# Patient Record
Sex: Male | Born: 1960 | Race: White | Hispanic: No | Marital: Single | Smoking: Never smoker
Health system: Southern US, Community
[De-identification: ages and names within clinical notes are randomized; demographics above are authoritative.]

## PROBLEM LIST (undated history)

## (undated) DIAGNOSIS — G629 Polyneuropathy, unspecified: Secondary | ICD-10-CM

## (undated) DIAGNOSIS — J329 Chronic sinusitis, unspecified: Secondary | ICD-10-CM

## (undated) DIAGNOSIS — I10 Essential (primary) hypertension: Secondary | ICD-10-CM

## (undated) DIAGNOSIS — R002 Palpitations: Secondary | ICD-10-CM

## (undated) HISTORY — PX: HIP SURGERY: SHX245

## (undated) HISTORY — PX: LUMBAR DISC SURGERY: SHX700

---

## 2020-04-06 ENCOUNTER — Institutional Professional Consult (permissible substitution): Payer: Self-pay | Admitting: Family Medicine

## 2020-04-18 ENCOUNTER — Other Ambulatory Visit: Payer: Self-pay

## 2020-04-18 ENCOUNTER — Encounter: Payer: Self-pay | Admitting: Family Medicine

## 2020-04-18 ENCOUNTER — Ambulatory Visit (INDEPENDENT_AMBULATORY_CARE_PROVIDER_SITE_OTHER): Payer: BC Managed Care – PPO | Admitting: Family Medicine

## 2020-04-18 VITALS — BP 112/8 | HR 72 | Temp 97.1°F | Ht 74.5 in | Wt 204.0 lb

## 2020-04-18 DIAGNOSIS — Z23 Encounter for immunization: Secondary | ICD-10-CM | POA: Diagnosis not present

## 2020-04-18 DIAGNOSIS — Z1211 Encounter for screening for malignant neoplasm of colon: Secondary | ICD-10-CM

## 2020-04-18 DIAGNOSIS — Z0000 Encounter for general adult medical examination without abnormal findings: Secondary | ICD-10-CM

## 2020-04-18 DIAGNOSIS — Z96641 Presence of right artificial hip joint: Secondary | ICD-10-CM | POA: Diagnosis not present

## 2020-04-18 DIAGNOSIS — Z9889 Other specified postprocedural states: Secondary | ICD-10-CM | POA: Diagnosis not present

## 2020-04-18 DIAGNOSIS — Z1159 Encounter for screening for other viral diseases: Secondary | ICD-10-CM

## 2020-04-18 NOTE — Progress Notes (Signed)
nde  Subjective:    Patient ID: Nicholas Bright, male    DOB: April 12, 1960, 60 y.o.   MRN: [id number]  HPI He is here for complete examination. He is the new Runner, broadcasting/film/video at [employer]. He does have a previous history of low back pain with lumbar surgery and is also had injections. He is also had right hip replacement. He seems to doing well from them but does occasionally have back pain. He is planning to start a exercise and weight training regimen. His immunizations were reviewed. He does not smoke and drinks socially. His father had CABG at age 60 and mother had a stent at age 60. Otherwise family and social history as well as health maintenance and immunizations was reviewed   Review of Systems  All other systems reviewed and are negative.      Objective:   Physical Exam Alert and in no distress. Tympanic membranes and canals are normal. Pharyngeal area is normal. Neck is supple without adenopathy or thyromegaly. Cardiac exam shows a regular sinus rhythm without murmurs or gallops. Lungs are clear to auscultation. Abdominal exam shows no masses or tenderness with normal bowel sounds EKG read by me does show early RBBB as well as possible LVH.  Ventricular rate around 78.      Assessment & Plan:  Routine general medical examination at a health care facility - Plan: CBC with Differential/Platelet, Comprehensive metabolic panel, Lipid panel, EKG 12-Lead  Need for Tdap vaccination  S/P total right hip arthroplasty  History of lumbar laminectomy  Screening for colon cancer - Plan: Cologuard  Need for hepatitis C screening test - Plan: Hepatitis C antibody Encouraged him to get involved and stay involved with exercise. Discussed his back pain with him and when it starts to give him trouble, he will call me and I will expedite getting him taken care of to be seen by orthopedics and possibly set up for epidurals. He was comfortable with that. He will return in the future for Shingrix

## 2020-04-19 ENCOUNTER — Encounter: Payer: Self-pay | Admitting: Family Medicine

## 2020-04-19 LAB — COMPREHENSIVE METABOLIC PANEL
ALT: 25 IU/L (ref 0–44)
AST: 33 IU/L (ref 0–40)
Albumin/Globulin Ratio: 1.8 (ref 1.2–2.2)
Albumin: 4.6 g/dL (ref 3.8–4.8)
Alkaline Phosphatase: 60 IU/L (ref 44–121)
BUN/Creatinine Ratio: 14 (ref 10–24)
BUN: 13 mg/dL (ref 8–27)
Bilirubin Total: 0.6 mg/dL (ref 0.0–1.2)
CO2: 20 mmol/L (ref 20–29)
Calcium: 9.7 mg/dL (ref 8.6–10.2)
Chloride: 105 mmol/L (ref 96–106)
Creatinine, Ser: 0.96 mg/dL (ref 0.76–1.27)
GFR calc Af Amer: 98 mL/min/{1.73_m2} (ref >59)
GFR calc non Af Amer: 85 mL/min/{1.73_m2} (ref >59)
Globulin, Total: 2.6 g/dL (ref 1.5–4.5)
Glucose: 99 mg/dL (ref 65–99)
Potassium: 4.6 mmol/L (ref 3.5–5.2)
Sodium: 141 mmol/L (ref 134–144)
Total Protein: 7.2 g/dL (ref 6.0–8.5)

## 2020-04-19 LAB — LIPID PANEL
Chol/HDL Ratio: 2.8 ratio (ref 0.0–5.0)
Cholesterol, Total: 178 mg/dL (ref 100–199)
HDL: 63 mg/dL (ref >39)
LDL Chol Calc (NIH): 95 mg/dL (ref 0–99)
Triglycerides: 111 mg/dL (ref 0–149)
VLDL Cholesterol Cal: 20 mg/dL (ref 5–40)

## 2020-04-19 LAB — CBC WITH DIFFERENTIAL/PLATELET
Basophils Absolute: 0.1 10*3/uL (ref 0.0–0.2)
Basos: 1 %
EOS (ABSOLUTE): 0.4 10*3/uL (ref 0.0–0.4)
Eos: 6 %
Hematocrit: 45 % (ref 37.5–51.0)
Hemoglobin: 15.5 g/dL (ref 13.0–17.7)
Immature Grans (Abs): 0 10*3/uL (ref 0.0–0.1)
Immature Granulocytes: 0 %
Lymphocytes Absolute: 2 10*3/uL (ref 0.7–3.1)
Lymphs: 29 %
MCH: 33.3 pg — ABNORMAL HIGH (ref 26.6–33.0)
MCHC: 34.4 g/dL (ref 31.5–35.7)
MCV: 97 fL (ref 79–97)
Monocytes Absolute: 0.6 10*3/uL (ref 0.1–0.9)
Monocytes: 9 %
Neutrophils Absolute: 3.9 10*3/uL (ref 1.4–7.0)
Neutrophils: 55 %
Platelets: 207 10*3/uL (ref 150–450)
RBC: 4.66 x10E6/uL (ref 4.14–5.80)
RDW: 12.1 % (ref 11.6–15.4)
WBC: 7 10*3/uL (ref 3.4–10.8)

## 2020-04-19 LAB — HEPATITIS C ANTIBODY: Hep C Virus Ab: <0.1 s/co ratio (ref 0.0–0.9)

## 2020-08-17 ENCOUNTER — Telehealth: Payer: Self-pay

## 2020-08-17 DIAGNOSIS — Z8249 Family history of ischemic heart disease and other diseases of the circulatory system: Secondary | ICD-10-CM

## 2020-08-17 DIAGNOSIS — Z1211 Encounter for screening for malignant neoplasm of colon: Secondary | ICD-10-CM

## 2020-08-17 HISTORY — PX: SKIN BIOPSY: SHX1

## 2020-08-17 NOTE — Telephone Encounter (Signed)
Pt. Called stating that he would like to do the cologuard instead of having a colonoscopy. He also wanted to know who you recommended for cardiology he would like to get in with one he has a family h/o of heart attacks and would just like a check up with a cardiologists.

## 2020-10-18 NOTE — Progress Notes (Signed)
Cardiology Office Note   Date:  10/20/2020   ID:  Nicholas Bright, DOB 08-15-1960, MRN [id number]  PCP:  Ronnald Nian, MD  Cardiologist:   None Referring:  Ronnald Nian, MD  Chief Complaint  Patient presents with   Abnormal ECG       History of Present Illness: Nicholas Bright is a 60 y.o. male who presents for evaluation of an abnormal EKG.  He is referred by Dr. Susann Givens.  He has no prior cardiac history.  He does have a family history of early coronary artery disease though he himself has never had any disease.  He is athletic and exercises routinely. The patient denies any new symptoms such as chest discomfort, neck or arm discomfort. There has been no new shortness of breath, PND or orthopnea. There have been no reported palpitations, presyncope or syncope.    He was found recently to have RSR prime in V1 and V2 on his EKG.  PMH:  None  Past Surgical History:  Procedure Laterality Date   HIP SURGERY     LUMBAR DISC SURGERY     SKIN BIOPSY Bilateral 08/17/2020   pigmented seborrheic keratosis, irritated     Current Outpatient Medications  Medication Sig Dispense Refill   Multiple Vitamins-Minerals (AIRBORNE PO) Take by mouth.     No current facility-administered medications for this visit.    Allergies:   Patient has no known allergies.    Social History:  The patient  reports that he has never smoked. He has never used smokeless tobacco. He reports current alcohol use of about 2.0 standard drinks per week. He reports that he does not use drugs.   Family History:  The patient's family history includes CAD (age of onset: 38) in his father; Congestive Heart Failure in his father.    ROS:  Please see the history of present illness.   Otherwise, review of systems are positive for none.   All other systems are reviewed and negative.    PHYSICAL EXAM: VS:  BP 130/80   Pulse 86   Ht 6\' 3"  (1.905 m)   Wt 205 lb 3.2 oz (93.1 kg)   SpO2 94%   BMI 25.65 kg/m  ,  BMI Body mass index is 25.65 kg/m. GENERAL:  Well appearing HEENT:  Pupils equal round and reactive, fundi not visualized, oral mucosa unremarkable NECK:  No jugular venous distention, waveform within normal limits, carotid upstroke brisk and symmetric, no bruits, no thyromegaly LYMPHATICS:  No cervical, inguinal adenopathy LUNGS:  Clear to auscultation bilaterally BACK:  No CVA tenderness CHEST:  Unremarkable HEART:  PMI not displaced or sustained,S1 and S2 within normal limits, no S3, no S4, no clicks, no rubs, NO murmurs ABD:  Flat, positive bowel sounds normal in frequency in pitch, no bruits, no rebound, no guarding, no midline pulsatile mass, no hepatomegaly, no splenomegaly EXT:  2 plus pulses throughout, no edema, no cyanosis no clubbing SKIN:  No rashes no nodules NEURO:  Cranial nerves II through XII grossly intact, motor grossly intact throughout PSYCH:  Cognitively intact, oriented to person place and time    EKG:  EKG is ordered today. The ekg ordered today demonstrates sinus rhythm, rate 86, axis within normal limits, intervals within normal limits, no acute ST-T wave changes.   Recent Labs: 04/18/2020: ALT 25; BUN 13; Creatinine, Ser 0.96; Hemoglobin 15.5; Platelets 207; Potassium 4.6; Sodium 141    Lipid Panel    Component Value Date/Time   CHOL  178 04/18/2020 1624   TRIG 111 04/18/2020 1624   HDL 63 04/18/2020 1624   CHOLHDL 2.8 04/18/2020 1624   LDLCALC 95 04/18/2020 1624      Wt Readings from Last 3 Encounters:  10/20/20 205 lb 3.2 oz (93.1 kg)  04/18/20 204 lb (92.5 kg)      Other studies Reviewed: Additional studies/ records that were reviewed today include: EKG and labs. Review of the above records demonstrates:  Please see elsewhere in the note.     ASSESSMENT AND PLAN:  Abnormal EKG: This looks to be just an RSR prime or interventricular conduction delay but does not represent any significant pathology.  No change in therapy.  Risk reduction:  He has a strong family history of early coronary artery disease.  I have suggested a coronary calcium score.  Dyslipidemia: His LDL is mildly elevated at 95.  Goals of therapy will be based on the results of the follow-up.  Of note we talked about a plant based diet.  Palpitations: He has some vague symptoms in the morning of his heart beating strong but he is doing some reading.  I have suggested a Kardia   Current medicines are reviewed at length with the patient today.  The patient does not have concerns regarding medicines.  The following changes have been made:  no change  Labs/ tests ordered today include:   Orders Placed This Encounter  Procedures   CT CARDIAC SCORING (SELF PAY ONLY)   EKG 12-Lead      Disposition:   FU with me as needed.      Signed, Rollene Rotunda, MD  10/20/2020 9:17 AM    [facility]

## 2020-10-20 ENCOUNTER — Ambulatory Visit: Payer: BC Managed Care – PPO | Admitting: Cardiology

## 2020-10-20 ENCOUNTER — Encounter: Payer: Self-pay | Admitting: Cardiology

## 2020-10-20 ENCOUNTER — Other Ambulatory Visit: Payer: Self-pay

## 2020-10-20 VITALS — BP 130/80 | HR 86 | Ht 75.0 in | Wt 205.2 lb

## 2020-10-20 DIAGNOSIS — Z8249 Family history of ischemic heart disease and other diseases of the circulatory system: Secondary | ICD-10-CM | POA: Diagnosis not present

## 2020-10-20 NOTE — Patient Instructions (Signed)
Medication Instructions:  NO CHANGES *If you need a refill on your cardiac medications before your next appointment, please call your pharmacy*   Testing/Procedures: Dr. Antoine Poche has ordered a CT coronary calcium score. This test is done at [address] [address]. This is $99 out of pocket.   Coronary CalciumScan A coronary calcium scan is an imaging test used to look for deposits of calcium and other fatty materials (plaques) in the inner lining of the blood vessels of the heart (coronary arteries). These deposits of calcium and plaques can partly clog and narrow the coronary arteries without producing any symptoms or warning signs. This puts a person at risk for a heart attack. This test can detect these deposits before symptoms develop. Tell a health care provider about: Any allergies you have. All medicines you are taking, including vitamins, herbs, eye drops, creams, and over-the-counter medicines. Any problems you or family members have had with anesthetic medicines. Any blood disorders you have. Any surgeries you have had. Any medical conditions you have. Whether you are pregnant or may be pregnant. What are the risks? Generally, this is a safe procedure. However, problems may occur, including: Harm to a pregnant woman and her unborn baby. This test involves the use of radiation. Radiation exposure can be dangerous to a pregnant woman and her unborn baby. If you are pregnant, you generally should not have this procedure done. Slight increase in the risk of cancer. This is because of the radiation involved in the test. What happens before the procedure? No preparation is needed for this procedure. What happens during the procedure? You will undress and remove any jewelry around your neck or chest. You will put on a hospital gown. Sticky electrodes will be placed on your chest. The electrodes will be connected to an electrocardiogram (ECG) machine to record a tracing of the  electrical activity of your heart. A CT scanner will take pictures of your heart. During this time, you will be asked to lie still and hold your breath for 2-3 seconds while a picture of your heart is being taken. The procedure may vary among health care providers and hospitals. What happens after the procedure? You can get dressed. You can return to your normal activities. It is up to you to get the results of your test. Ask your health care provider, or the department that is doing the test, when your results will be ready. Summary A coronary calcium scan is an imaging test used to look for deposits of calcium and other fatty materials (plaques) in the inner lining of the blood vessels of the heart (coronary arteries). Generally, this is a safe procedure. Tell your health care provider if you are pregnant or may be pregnant. No preparation is needed for this procedure. A CT scanner will take pictures of your heart. You can return to your normal activities after the scan is done. This information is not intended to replace advice given to you by your health care provider. Make sure you discuss any questions you have with your health care provider. Document Released: 08/10/2007 Document Revised: 01/01/2016 Document Reviewed: 01/01/2016 Elsevier Interactive Patient Education  2017 [employer].    Follow-Up: At [facility], you and your health needs are our priority.  As part of our continuing mission to provide you with exceptional heart care, we have created designated Provider Care Teams.  These Care Teams include your primary Cardiologist (physician) and Advanced Practice Providers (APPs -  Physician Assistants and Nurse  Practitioners) who all work together to provide you with the care you need, when you need it.  We recommend signing up for the patient portal called "MyChart".  Sign up information is provided on this After Visit Summary.  MyChart is used to connect with patients for  Virtual Visits (Telemedicine).  Patients are able to view lab/test results, encounter notes, upcoming appointments, etc.  Non-urgent messages can be sent to your provider as well.   To learn more about what you can do with MyChart, go to [email or web address].    Your next appointment:   AS NEEDED with Dr. Antoine Poche  Other Instructions  [employer] by Macy Mis documentary - Gamechanger

## 2020-11-16 ENCOUNTER — Other Ambulatory Visit: Payer: Self-pay

## 2020-11-16 ENCOUNTER — Ambulatory Visit (INDEPENDENT_AMBULATORY_CARE_PROVIDER_SITE_OTHER)
Admission: RE | Admit: 2020-11-16 | Discharge: 2020-11-16 | Disposition: A | Discharge status: Home / Self Care | Payer: Self-pay | Source: Ambulatory Visit | Attending: Cardiology | Admitting: Cardiology

## 2020-11-16 DIAGNOSIS — Z8249 Family history of ischemic heart disease and other diseases of the circulatory system: Secondary | ICD-10-CM

## 2020-11-20 ENCOUNTER — Telehealth: Payer: Self-pay | Admitting: *Deleted

## 2020-11-20 MED ORDER — ZOLPIDEM TARTRATE 10 MG PO TABS: 10.0000 mg | ORAL_TABLET | Freq: Every evening | ORAL | 1 refills | Status: DC | PRN

## 2020-11-20 NOTE — Telephone Encounter (Signed)
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 He was wondering if you could call something in for sleep. He think he may have taken [place] about 15 years ago. But is willing to take anything that you recommend. Patient uses Leonie Douglas.

## 2021-01-22 ENCOUNTER — Telehealth: Payer: Self-pay

## 2021-01-22 NOTE — Telephone Encounter (Signed)
Pt called and advise he is having sinus infection. Normally comes in for z pack but pt is out of town. Please advise [facility]

## 2021-01-23 ENCOUNTER — Telehealth: Payer: BC Managed Care – PPO | Admitting: Family Medicine

## 2021-03-12 ENCOUNTER — Other Ambulatory Visit (INDEPENDENT_AMBULATORY_CARE_PROVIDER_SITE_OTHER): Payer: BC Managed Care – PPO | Admitting: Orthopaedic Surgery

## 2021-03-12 ENCOUNTER — Ambulatory Visit (HOSPITAL_BASED_OUTPATIENT_CLINIC_OR_DEPARTMENT_OTHER): Payer: Self-pay | Admitting: Orthopaedic Surgery

## 2021-03-12 DIAGNOSIS — S8261XA Displaced fracture of lateral malleolus of right fibula, initial encounter for closed fracture: Secondary | ICD-10-CM
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
## 2021-03-13 ENCOUNTER — Ambulatory Visit: Payer: BC Managed Care – PPO | Admitting: Orthopedic Surgery

## 2021-03-13 ENCOUNTER — Ambulatory Visit: Payer: Self-pay

## 2021-03-13 ENCOUNTER — Encounter (HOSPITAL_COMMUNITY): Payer: Self-pay | Admitting: Orthopaedic Surgery

## 2021-03-13 ENCOUNTER — Other Ambulatory Visit: Payer: Self-pay

## 2021-03-13 ENCOUNTER — Encounter: Payer: Self-pay | Admitting: Orthopedic Surgery

## 2021-03-13 VITALS — BP 132/88 | HR 94 | Ht 75.0 in | Wt 205.0 lb

## 2021-03-13 DIAGNOSIS — S6992XA Unspecified injury of left wrist, hand and finger(s), initial encounter: Secondary | ICD-10-CM

## 2021-03-13 DIAGNOSIS — S62337A Displaced fracture of neck of fifth metacarpal bone, left hand, initial encounter for closed fracture: Secondary | ICD-10-CM

## 2021-03-13 DIAGNOSIS — S62339A Displaced fracture of neck of unspecified metacarpal bone, initial encounter for closed fracture: Secondary | ICD-10-CM | POA: Insufficient documentation
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
## 2021-03-13 NOTE — Progress Notes (Signed)
Nicholas Bright denies chest pain or shortness of breath. Patient denies having any s/s of Covid in his household.  Patient denies any known exposure to Covid.   PCP is Dr. Sharlot Gowda.   Nicholas Bright saw Dr. Nanci Pina in 10/20/20 to review an abnomal EKG. Dr. Nanci Pina did not add medication or require any other studies. Patient is to see Dr Nanci Pina if needed.  I instructed patient to wah up well with antibiotic soap, if it is available.  Dry off with a clean towel. Do not put lotion, powder, cologne or deodorant or makeup.No jewelry or piercings. Men may shave their face and neck. Woman should not shave. No nail polish, artificial or acrylic nails. Wear clean clothes, brush your teeth. Glasses, contact lens,dentures or partials may not be worn in the OR. If you need to wear them, please bring a case for glasses, do not wear contacts or bring a case, the hospital does not have contact cases, dentures or partials will have to be removed , make sure they are clean, we will provide a denture cup to put them in. You will need some one to drive you home and a responsible person over the age of 60 to stay with you for the first 24 hours after surgery.

## 2021-03-14 ENCOUNTER — Ambulatory Visit (HOSPITAL_COMMUNITY): Payer: BC Managed Care – PPO | Admitting: Certified Registered Nurse Anesthetist

## 2021-03-14 ENCOUNTER — Encounter (HOSPITAL_COMMUNITY)
Admission: RE | Disposition: A | Discharge status: Home / Self Care | Payer: Self-pay | Source: Home / Self Care | Attending: Orthopaedic Surgery

## 2021-03-14 ENCOUNTER — Ambulatory Visit (HOSPITAL_COMMUNITY): Payer: BC Managed Care – PPO

## 2021-03-14 ENCOUNTER — Other Ambulatory Visit (HOSPITAL_BASED_OUTPATIENT_CLINIC_OR_DEPARTMENT_OTHER): Payer: Self-pay | Admitting: Orthopaedic Surgery

## 2021-03-14 ENCOUNTER — Ambulatory Visit (HOSPITAL_COMMUNITY)
Admission: RE | Admit: 2021-03-14 | Discharge: 2021-03-14 | Disposition: A | Discharge status: Home / Self Care | Payer: BC Managed Care – PPO | Attending: Orthopaedic Surgery | Admitting: Orthopaedic Surgery

## 2021-03-14 ENCOUNTER — Encounter (HOSPITAL_COMMUNITY): Payer: Self-pay | Admitting: Orthopaedic Surgery

## 2021-03-14 DIAGNOSIS — S93432A Sprain of tibiofibular ligament of left ankle, initial encounter: Secondary | ICD-10-CM | POA: Diagnosis not present

## 2021-03-14 DIAGNOSIS — Y9355 Activity, bike riding: Secondary | ICD-10-CM | POA: Diagnosis not present

## 2021-03-14 DIAGNOSIS — S82852A Displaced trimalleolar fracture of left lower leg, initial encounter for closed fracture: Secondary | ICD-10-CM | POA: Insufficient documentation

## 2021-03-14 DIAGNOSIS — S8261XA Displaced fracture of lateral malleolus of right fibula, initial encounter for closed fracture: Secondary | ICD-10-CM

## 2021-03-14 DIAGNOSIS — S62337A Displaced fracture of neck of fifth metacarpal bone, left hand, initial encounter for closed fracture: Secondary | ICD-10-CM | POA: Diagnosis not present

## 2021-03-14 DIAGNOSIS — Z419 Encounter for procedure for purposes other than remedying health state, unspecified: Secondary | ICD-10-CM

## 2021-03-14 HISTORY — PX: OPEN REDUCTION INTERNAL FIXATION (ORIF) METACARPAL: SHX6234

## 2021-03-14 HISTORY — PX: ORIF ANKLE FRACTURE: SHX5408

## 2021-03-14 LAB — CBC
HCT: 39.8 % (ref 39.0–52.0)
Hemoglobin: 13.8 g/dL (ref 13.0–17.0)
MCH: 34.3 pg — ABNORMAL HIGH (ref 26.0–34.0)
MCHC: 34.7 g/dL (ref 30.0–36.0)
MCV: 99 fL (ref 80.0–100.0)
Platelets: 242 10*3/uL (ref 150–400)
RBC: 4.02 MIL/uL — ABNORMAL LOW (ref 4.22–5.81)
RDW: 12.3 % (ref 11.5–15.5)
WBC: 5.9 10*3/uL (ref 4.0–10.5)
nRBC: 0 % (ref 0.0–0.2)

## 2021-03-14 LAB — SURGICAL PCR SCREEN
MRSA, PCR: NEGATIVE
Staphylococcus aureus: NEGATIVE

## 2021-03-14 SURGERY — OPEN REDUCTION INTERNAL FIXATION (ORIF) ANKLE FRACTURE
Anesthesia: General | Site: Finger | Laterality: Left

## 2021-03-14 MED ORDER — IBUPROFEN 800 MG PO TABS
800.0000 mg | ORAL_TABLET | Freq: Three times a day (TID) | ORAL | 0 refills | Status: AC
Start: 2021-03-14 — End: 2021-03-24

## 2021-03-14 MED ORDER — OXYCODONE HCL 5 MG PO TABS
5.0000 mg | ORAL_TABLET | Freq: Once | ORAL | Status: AC
  Administered 2021-03-14: 5 mg via ORAL

## 2021-03-14 MED ORDER — PROPOFOL 10 MG/ML IV BOLUS
INTRAVENOUS | Status: AC
  Filled 2021-03-14: qty 20

## 2021-03-14 MED ORDER — MIDAZOLAM HCL 2 MG/2ML IJ SOLN
INTRAMUSCULAR | Status: AC
  Administered 2021-03-14: 3 mg via INTRAVENOUS
  Filled 2021-03-14: qty 2

## 2021-03-14 MED ORDER — FENTANYL CITRATE (PF) 100 MCG/2ML IJ SOLN
INTRAMUSCULAR | Status: AC
  Filled 2021-03-14: qty 2

## 2021-03-14 MED ORDER — PROPOFOL 10 MG/ML IV BOLUS
INTRAVENOUS | Status: DC | PRN
Start: 2021-03-14 — End: 2021-03-14
  Administered 2021-03-14: 200 mg via INTRAVENOUS

## 2021-03-14 MED ORDER — LIDOCAINE 2% (20 MG/ML) 5 ML SYRINGE
INTRAMUSCULAR | Status: DC | PRN
Start: 2021-03-14 — End: 2021-03-14
  Administered 2021-03-14: 20 mg via INTRAVENOUS

## 2021-03-14 MED ORDER — CLONIDINE HCL (ANALGESIA) 100 MCG/ML EP SOLN
EPIDURAL | Status: DC | PRN
  Administered 2021-03-14 (×3): 50 ug

## 2021-03-14 MED ORDER — VANCOMYCIN HCL 1000 MG IV SOLR
INTRAVENOUS | Status: AC
  Filled 2021-03-14: qty 20

## 2021-03-14 MED ORDER — LIDOCAINE 2% (20 MG/ML) 5 ML SYRINGE
INTRAMUSCULAR | Status: AC
  Filled 2021-03-14: qty 5

## 2021-03-14 MED ORDER — ACETAMINOPHEN 500 MG PO TABS: 500.0000 mg | ORAL_TABLET | Freq: Three times a day (TID) | ORAL | 0 refills | Status: AC

## 2021-03-14 MED ORDER — FENTANYL CITRATE (PF) 100 MCG/2ML IJ SOLN
150.0000 ug | Freq: Once | INTRAMUSCULAR | Status: AC
  Filled 2021-03-14: qty 3

## 2021-03-14 MED ORDER — ROPIVACAINE HCL 5 MG/ML IJ SOLN
INTRAMUSCULAR | Status: DC | PRN
  Administered 2021-03-14: 15 mL via PERINEURAL

## 2021-03-14 MED ORDER — AMISULPRIDE (ANTIEMETIC) 5 MG/2ML IV SOLN: 10.0000 mg | Freq: Once | INTRAVENOUS | Status: DC | PRN

## 2021-03-14 MED ORDER — ASPIRIN EC 325 MG PO TBEC: 325.0000 mg | DELAYED_RELEASE_TABLET | Freq: Every day | ORAL | 0 refills | Status: DC

## 2021-03-14 MED ORDER — OXYCODONE HCL 5 MG PO CAPS
5.0000 mg | ORAL_CAPSULE | ORAL | 0 refills | Status: DC | PRN
Start: 2021-03-14 — End: 2021-03-19

## 2021-03-14 MED ORDER — ORAL CARE MOUTH RINSE: 15.0000 mL | Freq: Once | OROMUCOSAL | Status: AC

## 2021-03-14 MED ORDER — ACETAMINOPHEN 500 MG PO TABS
1000.0000 mg | ORAL_TABLET | Freq: Once | ORAL | Status: AC
  Administered 2021-03-14: 1000 mg via ORAL
  Filled 2021-03-14: qty 2

## 2021-03-14 MED ORDER — DEXAMETHASONE SODIUM PHOSPHATE 4 MG/ML IJ SOLN
INTRAMUSCULAR | Status: DC | PRN
  Administered 2021-03-14: 5 mg via INTRAVENOUS

## 2021-03-14 MED ORDER — ONDANSETRON HCL 4 MG/2ML IJ SOLN
INTRAMUSCULAR | Status: DC | PRN
  Administered 2021-03-14: 4 mg via INTRAVENOUS

## 2021-03-14 MED ORDER — 0.9 % SODIUM CHLORIDE (POUR BTL) OPTIME
TOPICAL | Status: DC | PRN
Start: 2021-03-14 — End: 2021-03-14
  Administered 2021-03-14: 2000 mL

## 2021-03-14 MED ORDER — LACTATED RINGERS IV SOLN: INTRAVENOUS | Status: DC

## 2021-03-14 MED ORDER — FENTANYL CITRATE (PF) 100 MCG/2ML IJ SOLN
INTRAMUSCULAR | Status: DC | PRN
  Administered 2021-03-14: 50 ug via INTRAVENOUS

## 2021-03-14 MED ORDER — FENTANYL CITRATE (PF) 100 MCG/2ML IJ SOLN
INTRAMUSCULAR | Status: AC
  Administered 2021-03-14: 150 ug via INTRAVENOUS
  Filled 2021-03-14: qty 2

## 2021-03-14 MED ORDER — TRANEXAMIC ACID-NACL 1000-0.7 MG/100ML-% IV SOLN
1000.0000 mg | INTRAVENOUS | Status: AC
  Administered 2021-03-14: 1000 mg via INTRAVENOUS
  Filled 2021-03-14: qty 100

## 2021-03-14 MED ORDER — BUPIVACAINE HCL (PF) 0.25 % IJ SOLN
INTRAMUSCULAR | Status: AC
  Filled 2021-03-14: qty 30

## 2021-03-14 MED ORDER — FENTANYL CITRATE (PF) 100 MCG/2ML IJ SOLN
25.0000 ug | INTRAMUSCULAR | Status: DC | PRN
  Administered 2021-03-14: 25 ug via INTRAVENOUS

## 2021-03-14 MED ORDER — CEFAZOLIN SODIUM-DEXTROSE 2-3 GM-%(50ML) IV SOLR
INTRAVENOUS | Status: DC | PRN
  Administered 2021-03-14: 2 g via INTRAVENOUS

## 2021-03-14 MED ORDER — DEXAMETHASONE SODIUM PHOSPHATE 10 MG/ML IJ SOLN
INTRAMUSCULAR | Status: AC
  Filled 2021-03-14: qty 1

## 2021-03-14 MED ORDER — MIDAZOLAM HCL 2 MG/2ML IJ SOLN
3.0000 mg | Freq: Once | INTRAMUSCULAR | Status: AC
  Filled 2021-03-14: qty 3

## 2021-03-14 MED ORDER — CEFAZOLIN SODIUM-DEXTROSE 2-4 GM/100ML-% IV SOLN
2.0000 g | INTRAVENOUS | Status: DC
  Filled 2021-03-14: qty 100

## 2021-03-14 MED ORDER — OXYCODONE HCL 5 MG PO TABS
ORAL_TABLET | ORAL | Status: AC
  Filled 2021-03-14: qty 1

## 2021-03-14 MED ORDER — GABAPENTIN 300 MG PO CAPS
300.0000 mg | ORAL_CAPSULE | Freq: Once | ORAL | Status: AC
  Administered 2021-03-14: 300 mg via ORAL
  Filled 2021-03-14: qty 1

## 2021-03-14 MED ORDER — MIDAZOLAM HCL 5 MG/5ML IJ SOLN
INTRAMUSCULAR | Status: DC | PRN
Start: 2021-03-14 — End: 2021-03-14
  Administered 2021-03-14: 1 mg via INTRAVENOUS

## 2021-03-14 MED ORDER — BUPIVACAINE-EPINEPHRINE (PF) 0.25% -1:200000 IJ SOLN
INTRAMUSCULAR | Status: DC | PRN
  Administered 2021-03-14: 20 mL via PERINEURAL
  Administered 2021-03-14: 30 mL via PERINEURAL

## 2021-03-14 MED ORDER — PHENYLEPHRINE HCL-NACL 20-0.9 MG/250ML-% IV SOLN
INTRAVENOUS | Status: DC | PRN
  Administered 2021-03-14: 20 ug/min via INTRAVENOUS

## 2021-03-14 MED ORDER — ONDANSETRON HCL 4 MG/2ML IJ SOLN
INTRAMUSCULAR | Status: AC
  Filled 2021-03-14: qty 2

## 2021-03-14 MED ORDER — MIDAZOLAM HCL 2 MG/2ML IJ SOLN
INTRAMUSCULAR | Status: AC
  Filled 2021-03-14: qty 2

## 2021-03-14 MED ORDER — CHLORHEXIDINE GLUCONATE 0.12 % MT SOLN
15.0000 mL | Freq: Once | OROMUCOSAL | Status: AC
  Administered 2021-03-14: 15 mL via OROMUCOSAL
  Filled 2021-03-14: qty 15

## 2021-03-14 SURGICAL SUPPLY — 94 items
BAG COUNTER SPONGE SURGICOUNT (BAG) ×6 IMPLANT
BANDAGE ESMARK 6X9 LF (GAUZE/BANDAGES/DRESSINGS) IMPLANT
BIT DRILL 2 CANN GRADUATED (BIT) ×1 IMPLANT
BIT DRILL 2.5 CANN LNG (BIT) ×1 IMPLANT
BIT DRILL 2.7 (BIT) ×1
BIT DRILL 2.7X2.7/3XSCR ANKL (BIT) IMPLANT
BIT DRL 2.7X2.7/3XSCR ANKL (BIT) ×2
BLADE CLIPPER SURG (BLADE) IMPLANT
BNDG COHESIVE 4X5 TAN STRL (GAUZE/BANDAGES/DRESSINGS) ×3 IMPLANT
BNDG ELASTIC 3X5.8 VLCR STR LF (GAUZE/BANDAGES/DRESSINGS) ×3 IMPLANT
BNDG ELASTIC 4X5.8 VLCR STR LF (GAUZE/BANDAGES/DRESSINGS) ×4 IMPLANT
BNDG ELASTIC 6X5.8 VLCR STR LF (GAUZE/BANDAGES/DRESSINGS) ×1 IMPLANT
BNDG ESMARK 4X9 LF (GAUZE/BANDAGES/DRESSINGS) ×3 IMPLANT
BNDG ESMARK 6X9 LF (GAUZE/BANDAGES/DRESSINGS)
BNDG GAUZE ELAST 4 BULKY (GAUZE/BANDAGES/DRESSINGS) ×6 IMPLANT
CORD BIPOLAR FORCEPS 12FT (ELECTRODE) ×3 IMPLANT
COVER SURGICAL LIGHT HANDLE (MISCELLANEOUS) ×6 IMPLANT
CUFF TOURN SGL QUICK 18X4 (TOURNIQUET CUFF) ×3 IMPLANT
CUFF TOURN SGL QUICK 24 (TOURNIQUET CUFF)
CUFF TRNQT CYL 24X4X16.5-23 (TOURNIQUET CUFF) IMPLANT
DRAIN TLS ROUND 10FR (DRAIN) IMPLANT
DRAPE OEC MINIVIEW 54X84 (DRAPES) ×3 IMPLANT
DRAPE SURG 17X11 SM STRL (DRAPES) ×3 IMPLANT
DRAPE U-SHAPE 47X51 STRL (DRAPES) ×3 IMPLANT
DRSG PAD ABDOMINAL 8X10 ST (GAUZE/BANDAGES/DRESSINGS) ×3 IMPLANT
DRSG XEROFORM 1X8 (GAUZE/BANDAGES/DRESSINGS) ×1 IMPLANT
ELECT REM PT RETURN 9FT ADLT (ELECTROSURGICAL) ×3
ELECTRODE REM PT RTRN 9FT ADLT (ELECTROSURGICAL) ×2 IMPLANT
GAUZE 4X4 16PLY ~~LOC~~+RFID DBL (SPONGE) IMPLANT
GAUZE SPONGE 4X4 12PLY STRL (GAUZE/BANDAGES/DRESSINGS) ×5 IMPLANT
GAUZE XEROFORM 5X9 LF (GAUZE/BANDAGES/DRESSINGS) ×1 IMPLANT
GLOVE SURG ORTHO LTX SZ8 (GLOVE) ×3 IMPLANT
GLOVE SURG ORTHO LTX SZ9 (GLOVE) ×3 IMPLANT
GLOVE SURG UNDER POLY LF SZ8.5 (GLOVE) ×3 IMPLANT
GLOVE SURG UNDER POLY LF SZ9 (GLOVE) ×3 IMPLANT
GOWN STRL REUS W/ TWL LRG LVL3 (GOWN DISPOSABLE) ×6 IMPLANT
GOWN STRL REUS W/ TWL XL LVL3 (GOWN DISPOSABLE) ×8 IMPLANT
GOWN STRL REUS W/TWL LRG LVL3 (GOWN DISPOSABLE) ×3
GOWN STRL REUS W/TWL XL LVL3 (GOWN DISPOSABLE) ×4
IMPL TIGHTROP W/DRV K-LESS (Anchor) IMPLANT
IMPLANT TIGHTROPE W/DRV K-LESS (Anchor) ×3 IMPLANT
K-WIRE DBL TROCAR .045X4 (WIRE) ×3
KIT BASIN OR (CUSTOM PROCEDURE TRAY) ×6 IMPLANT
KIT INNATE INSTRUMENT FOR 4.5 (INSTRUMENTS) ×1 IMPLANT
KIT TURNOVER KIT B (KITS) ×6 IMPLANT
KWIRE DBL TROCAR .045X4 (WIRE) IMPLANT
MANIFOLD NEPTUNE II (INSTRUMENTS) ×6 IMPLANT
NAIL IM THRD INNATE 4.5X45 (Nail) ×1 IMPLANT
NDL HYPO 25X1 1.5 SAFETY (NEEDLE) ×2 IMPLANT
NEEDLE HYPO 25X1 1.5 SAFETY (NEEDLE) ×3 IMPLANT
NS IRRIG 1000ML POUR BTL (IV SOLUTION) ×6 IMPLANT
PACK ORTHO EXTREMITY (CUSTOM PROCEDURE TRAY) ×6 IMPLANT
PAD ARMBOARD 7.5X6 YLW CONV (MISCELLANEOUS) ×12 IMPLANT
PAD CAST 3X4 CTTN HI CHSV (CAST SUPPLIES) IMPLANT
PAD CAST 4YDX4 CTTN HI CHSV (CAST SUPPLIES) ×2 IMPLANT
PADDING CAST COTTON 3X4 STRL (CAST SUPPLIES) ×1
PADDING CAST COTTON 4X4 STRL (CAST SUPPLIES) ×3
PADDING CAST COTTON 6X4 STRL (CAST SUPPLIES) ×1 IMPLANT
PLATE THIRD TUBULAR 7 HOLE (Plate) ×1 IMPLANT
SCREW CANCELLOUS 4.0X22 (Screw) ×1 IMPLANT
SCREW LOW PROFILE 3.5X16 (Screw) ×2 IMPLANT
SCREW N/L CORTICAL 2.7X28 (Screw) ×1 IMPLANT
SCREW NLOCK T15 FT 18X3.5XST (Screw) IMPLANT
SCREW NON LOCK 3.5X18MM (Screw) ×1 IMPLANT
SCREW NON LOCKING 4.0X20 (Screw) ×1 IMPLANT
SCREW NON-LOCKING 3.5X12MM (Screw) ×1 IMPLANT
SCREW NON-LOCKING 3.5X20 ANKLE (Screw) ×1 IMPLANT
SLING ARM FOAM STRAP LRG (SOFTGOODS) ×1 IMPLANT
SOAP 2 % CHG 4 OZ (WOUND CARE) ×3 IMPLANT
SPLINT FIBERGLASS 4X30 (CAST SUPPLIES) ×1 IMPLANT
STAPLER VISISTAT 35W (STAPLE) IMPLANT
STRIP CLOSURE SKIN 1/2X4 (GAUZE/BANDAGES/DRESSINGS) IMPLANT
SUCTION FRAZIER HANDLE 10FR (MISCELLANEOUS) ×1
SUCTION TUBE FRAZIER 10FR DISP (MISCELLANEOUS) ×2 IMPLANT
SUT ETHILON 2 0 PSLX (SUTURE) IMPLANT
SUT ETHILON 3 0 FSL (SUTURE) ×2 IMPLANT
SUT ETHILON 4 0 FS 1 (SUTURE) ×1 IMPLANT
SUT ETHILON 4 0 PS 2 18 (SUTURE) IMPLANT
SUT MNCRL AB 4-0 PS2 18 (SUTURE) IMPLANT
SUT MNCRL+ AB 3-0 CT1 36 (SUTURE) IMPLANT
SUT MONOCRYL AB 3-0 CT1 36IN (SUTURE) ×1
SUT VIC AB 0 CT1 27 (SUTURE) ×1
SUT VIC AB 0 CT1 27XBRD ANBCTR (SUTURE) IMPLANT
SUT VIC AB 2-0 CT1 27 (SUTURE) ×1
SUT VIC AB 2-0 CT1 TAPERPNT 27 (SUTURE) ×2 IMPLANT
SUT VIC AB 2-0 FS1 27 (SUTURE) IMPLANT
SUT VICRYL 4-0 PS2 18IN ABS (SUTURE) IMPLANT
SYR CONTROL 10ML LL (SYRINGE) IMPLANT
SYSTEM CHEST DRAIN TLS 7FR (DRAIN) IMPLANT
TOWEL GREEN STERILE (TOWEL DISPOSABLE) ×6 IMPLANT
TOWEL GREEN STERILE FF (TOWEL DISPOSABLE) ×6 IMPLANT
TUBE CONNECTING 12X1/4 (SUCTIONS) ×6 IMPLANT
WATER STERILE IRR 1000ML POUR (IV SOLUTION) ×3 IMPLANT
YANKAUER SUCT BULB TIP NO VENT (SUCTIONS) IMPLANT

## 2021-03-14 NOTE — Anesthesia Preprocedure Evaluation (Signed)
Anesthesia Evaluation  Patient identified by MRN, date of birth, ID band Patient awake    Reviewed: Allergy & Precautions, NPO status , Patient's Chart, lab work & pertinent test results  Airway Mallampati: II  TM Distance: >3 FB     Dental  (+) Dental Advisory Given   Pulmonary neg pulmonary ROS,    breath sounds clear to auscultation       Cardiovascular negative cardio ROS   Rhythm:Regular Rate:Normal     Neuro/Psych negative neurological ROS     GI/Hepatic negative GI ROS, Neg liver ROS,   Endo/Other  negative endocrine ROS  Renal/GU negative Renal ROS     Musculoskeletal   Abdominal   Peds  Hematology negative hematology ROS (+)   Anesthesia Other Findings   Reproductive/Obstetrics                             Anesthesia Physical Anesthesia Plan  ASA: 1  Anesthesia Plan: General   Post-op Pain Management: Tylenol PO (pre-op), Minimal or no pain anticipated and Regional block   Induction: Intravenous  PONV Risk Score and Plan: 2 and Dexamethasone, Ondansetron and Treatment may vary due to age or medical condition  Airway Management Planned: LMA  Additional Equipment: None  Intra-op Plan:   Post-operative Plan: Extubation in OR  Informed Consent: I have reviewed the patients History and Physical, chart, labs and discussed the procedure including the risks, benefits and alternatives for the proposed anesthesia with the patient or authorized representative who has indicated his/her understanding and acceptance.     Dental advisory given  Plan Discussed with: CRNA  Anesthesia Plan Comments:         Anesthesia Quick Evaluation
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
## 2021-03-14 NOTE — Interval H&P Note (Signed)
History and Physical Interval Note:  03/14/2021 10:57 AM  Nicholas Bright  has presented today for surgery, with the diagnosis of Fibula Fracture.  The various methods of treatment have been discussed with the patient and family. After consideration of risks, benefits and other options for treatment, the patient has consented to  Procedure(s): OPEN REDUCTION INTERNAL FIXATION (ORIF) ANKLE FRACTURE (Left) OPEN REDUCTION INTERNAL FIXATION (ORIF) LEFT 5TH METACARPAL (Left) as a surgical intervention.  The patient's history has been reviewed, patient examined, no change in status, stable for surgery.  I have reviewed the patient's chart and labs.  Questions were answered to the patient's satisfaction.     Gerritt Galentine Leighton Luster

## 2021-03-14 NOTE — Discharge Instructions (Addendum)
Discharge Instructions    Attending Surgeon: Vanetta Mulders, MD Office Phone Number: [phone]   Diagnosis and Procedures:    Surgeries Performed: Left ankle open reduction internal fixation  Discharge Plan:    Diet: Resume usual diet. Begin with light or bland foods.  Drink plenty of fluids.  Activity:  Non-weightbearing, utilizing crutches, until seen at postoperative Physical Therapy visit this week. Please keep boot in place You are advised to go home directly from the hospital or surgical center. Restrict your activities.  GENERAL INSTRUCTIONS: 1.  Keep your surgical site elevated above your heart for at least 5-7 days or longer to prevent swelling. This will improve your comfort and your overall recovery following surgery.     2. Please call Dr. Eddie Dibbles office at [phone] with questions Monday-Friday during business hours. If no one answers, please leave a message and someone should get back to the patient within 24 hours. For emergencies please call 911 or proceed to the emergency room.   3. Patient to notify surgical team if experiences any of the following: Bowel/Bladder dysfunction, uncontrolled pain, nerve/muscle weakness, incision with increased drainage or redness, nausea/vomiting and Fever greater than 101.0 F.  Be alert for signs of infection including redness, streaking, odor, fever or chills. Be alert for excessive pain or bleeding and notify your surgeon immediately.  WOUND INSTRUCTIONS:   Leave your dressing/cast/splint in place until your post operative visit.  Keep it clean and dry.  Always keep the incision clean and dry until the staples/sutures are removed. If there is no drainage from the incision you should keep it open to air. If there is drainage from the incision you must keep it covered at all times until the drainage stops  Do not soak in a bath tub, hot tub, pool, lake or other body of water until 21 days after your surgery and  your incision is completely dry and healed.  If you have removable sutures (or staples) they must be removed 10-14 days (unless otherwise instructed) from the day of your surgery.     1)  Elevate the extremity as much as possible.  2)  Keep the dressing clean and dry.  3)  Please call us if the dressing becomes wet or dirty.  4)  If you are experiencing worsening pain or worsening swelling, please call.     MEDICATIONS: Resume all previous home medications at the previous prescribed dose and frequency unless otherwise noted Start taking the  pain medications on an as-needed basis as prescribed  Please taper down pain medication over the next week following surgery.  Ideally you should not require a refill of any narcotic pain medication.  Take pain medication with food to minimize nausea. In addition to the prescribed pain medication, you may take over-the-counter pain relievers such as Tylenol.  Do NOT take additional tylenol if your pain medication already has tylenol in it.  Aspirin 325mg  daily for four weeks.      FOLLOWUP INSTRUCTIONS: 1. Follow up at the Physical Therapy Clinic 3-4 days following surgery. This appointment should be scheduled unless other arrangements have been made.The Physical Therapy scheduling number is [phone] if an appointment has not already been arranged.  2. Contact Dr. Eddie Dibbles office during office hours at [phone] or the practice after hours line at [phone] for non-emergencies. For medical emergencies call 911.   Discharge Location: Home   Nicholas Bright, M.D. Hand Surgery  POST-OPERATIVE DISCHARGE INSTRUCTIONS   PRESCRIPTIONS:  You have been given a prescription to be taken as directed for post-operative pain control.  You may also take over the counter ibuprofen/aleve and tylenol for pain. Take this as directed on the packaging. Do not exceed 3000 mg tylenol/acetaminophen in 24 hours.  Ibuprofen 600-800 mg (3-4) tablets by  mouth every 6 hours as needed for pain.  OR Aleve 2 tablets by mouth every 12 hours (twice daily) as needed for pain.  AND/OR Tylenol 1000 mg (2 tablets) every 8 hours as needed for pain.  Please use your pain medication carefully, as refills are limited and you may not be provided with one.  As stated above, please use over the counter pain medicine - it will also be helpful with decreasing your swelling.    ANESTHESIA: After your surgery, post-surgical discomfort or pain is likely. This discomfort can last several days to a few weeks. At certain times of the day your discomfort may be more intense.   Did you receive a nerve block?  A nerve block can provide pain relief for one hour to two days after your surgery. As long as the nerve block is working, you will experience little or no sensation in the area the surgeon operated on.  As the nerve block wears off, you will begin to experience pain or discomfort. It is very important that you begin taking your prescribed pain medication before the nerve block fully wears off. Treating your pain at the first sign of the block wearing off will ensure your pain is better controlled and more tolerable when full-sensation returns. Do not wait until the pain is intolerable, as the medicine will be less effective. It is better to treat pain in advance than to try and catch up.   General Anesthesia:  If you did not receive a nerve block during your surgery, you will need to start taking your pain medication shortly after your surgery and should continue to do so as prescribed by your surgeon.     ICE AND ELEVATION: You may use ice for the first 48-72 hours, but it is not critical.   Motion of your fingers is very important s to decrease the swelling.  Elevation, as much as possible for the next 48 hours, is critical for decreasing swelling as well as for pain relief. Elevation means when you are seated or lying down, you hand should be at or above your  heart. When walking, the hand needs to be at or above the level of your elbow.  If the bandage gets too tight, it may need to be loosened. Please contact our office and we will instruct you in how to do this.    SURGICAL BANDAGES:  Keep your dressing and/or splint clean and dry at all times.  Do not remove until you are seen again in the office.  If careful, you may place a plastic bag over your bandage and tape the end to shower, but be careful, do not get your bandages wet.     HAND THERAPY:  You may not need any. If you do, we will begin this at your follow up visit in the clinic.    ACTIVITY AND WORK: You are encouraged to move any fingers which are not in the bandage.  Light use of the fingers is allowed to assist the other hand with daily hygiene and eating, but strong gripping or lifting is often uncomfortable and should be avoided.    [facility] [address]  [place],  [place]  [address] [phone]

## 2021-03-14 NOTE — Progress Notes (Signed)
Orthopedic Tech Progress Note Patient Details:  Nicholas Bright 12-16-1960 [id number]  OR RN called re Nicholas Bright I drop off a LARGE CAM WALKER to PACU. Patient had not gotten there yet. Asked PACU RN called if he needed me to apply it to patient   Ortho Devices Type of Ortho Device: CAM walker Ortho Device/Splint Interventions: Ordered, Other (comment)   Post Interventions Patient Tolerated: Well Instructions Provided: Care of device  Donald Pore 03/14/2021, 1:55 PM

## 2021-03-14 NOTE — Anesthesia Procedure Notes (Signed)
Anesthesia Regional Block: Popliteal block   Pre-Anesthetic Checklist: , timeout performed,  Correct Patient, Correct Site, Correct Laterality,  Correct Procedure, Correct Position, site marked,  Risks and benefits discussed,  Surgical consent,  Pre-op evaluation,  At surgeon's request and post-op pain management  Laterality: Left  Prep: chloraprep       Needles:  Injection technique: Single-shot  Needle Type: Echogenic Needle     Needle Length: 9cm  Needle Gauge: 21     Additional Needles:   Procedures:,,,, ultrasound used (permanent image in chart),,    Narrative:  Start time: 03/14/2021 10:55 AM End time: 03/14/2021 11:02 AM Injection made incrementally with aspirations every 5 mL.  Performed by: Personally  Anesthesiologist: Marcene Duos, MD

## 2021-03-14 NOTE — Anesthesia Procedure Notes (Signed)
Anesthesia Regional Block: Other (Isolated left ulnar nerve block)   Pre-Anesthetic Checklist: , timeout performed,  Correct Patient, Correct Site, Correct Laterality,  Correct Procedure, Correct Position, site marked,  Risks and benefits discussed,  Surgical consent,  Pre-op evaluation,  At surgeon's request and post-op pain management  Laterality: Left  Prep: chloraprep       Needles:  Injection technique: Single-shot  Needle Type: Echogenic Stimulator Needle     Needle Length: 9cm  Needle Gauge: 21     Additional Needles:   Procedures:, nerve stimulator,,, ultrasound used (permanent image in chart),,     Nerve Stimulator or Paresthesia:  Response: 4th and 5th digit flextion, 0.5 mA  Additional Responses:   Narrative:  Start time: 03/14/2021 11:10 AM End time: 03/14/2021 11:17 AM Injection made incrementally with aspirations every 5 mL.  Performed by: Personally  Anesthesiologist: Marcene Duos, MD

## 2021-03-14 NOTE — Brief Op Note (Signed)
° °  Brief Op Note  Date of Surgery: 03/14/2021  Preoperative Diagnosis: Fibula Fracture  Postoperative Diagnosis: same  Procedure: Procedure(s): OPEN REDUCTION INTERNAL FIXATION (ORIF) ANKLE FRACTURE OPEN REDUCTION INTERNAL FIXATION (ORIF) LEFT 5TH METACARPAL  Implants: Implant Name Type Inv. Item Serial No. Manufacturer Lot No. LRB No. Used Action  PLATE THIRD TUBULAR 7 HOLE - [id number] Plate PLATE THIRD TUBULAR 7 HOLE  [employer]  Left 1 Implanted  SCREW NON LOCK 3.5X18MM - [id number] Screw SCREW NON LOCK 3.5X18MM  [employer]  Left 1 Implanted  SCREW LOW PROFILE 3.5X16 - [id number] Screw SCREW LOW PROFILE 3.5X16  [employer]  Left 2 Implanted  SCREW CANCELLOUS 4.0X22 - [id number] Screw SCREW CANCELLOUS 4.0X22  [employer]  Left 1 Implanted  SCREW NON LOCKING 4.0X20 - [id number] Screw SCREW NON LOCKING 4.0X20  [employer]  Left 1 Implanted  IMPLANT TIGHTROPE W/DRV K-LESS - [id number] Anchor IMPLANT TIGHTROPE W/DRV [employer] [id number] Left 1 Implanted  NAIL IM THRD INNATE 4.5X45 - [id number] Nail NAIL IM THRD INNATE 4.5X45  [employer] [id number] Left 1 Implanted  SCREW NON-LOCKING 3.5X20 ANKLE - [id number] Screw SCREW NON-LOCKING 3.5X20 ANKLE  [employer]  Left 1 Implanted    Surgeons: Surgeon(s): Huel Cote, MD Marlyne Beards, MD  Anesthesia: General    Estimated Blood Loss: See anesthesia record  Complications: None  Condition to PACU: Stable  Benancio Deeds, MD 03/14/2021 1:32 PM

## 2021-03-14 NOTE — Interval H&P Note (Signed)
History and Physical Interval Note:  03/14/2021 9:35 AM  Nicholas Bright  has presented today for surgery, with the diagnosis of Fibula Fracture.  The various methods of treatment have been discussed with the patient and family. After consideration of risks, benefits and other options for treatment, the patient has consented to  Procedure(s): OPEN REDUCTION INTERNAL FIXATION (ORIF) ANKLE FRACTURE (Left) OPEN REDUCTION INTERNAL FIXATION (ORIF) LEFT 5TH METACARPAL (Left) as a surgical intervention.  The patient's history has been reviewed, patient examined, no change in status, stable for surgery.  I have reviewed the patient's chart and labs.  Questions were answered to the patient's satisfaction.     Huel Cote

## 2021-03-14 NOTE — Anesthesia Postprocedure Evaluation (Signed)
Anesthesia Post Note  Patient: Nicholas Bright  Procedure(s) Performed: OPEN REDUCTION INTERNAL FIXATION (ORIF) ANKLE FRACTURE (Left: Ankle) OPEN REDUCTION INTERNAL FIXATION (ORIF) LEFT 5TH METACARPAL (Left: Finger)     Patient location during evaluation: PACU Anesthesia Type: General Level of consciousness: awake and alert Pain management: pain level controlled Vital Signs Assessment: post-procedure vital signs reviewed and stable Respiratory status: spontaneous breathing, nonlabored ventilation, respiratory function stable and patient connected to nasal cannula oxygen Cardiovascular status: blood pressure returned to baseline and stable Postop Assessment: no apparent nausea or vomiting Anesthetic complications: no   No notable events documented.  Last Vitals:  Vitals:   03/14/21 1411 03/14/21 1426  BP: (!) 150/106 (!) 156/105  Pulse: 76 74  Resp: 12 17  Temp:  (!) 36.1 C  SpO2: 98% 98%    Last Pain:  Vitals:   03/14/21 1415  TempSrc:   PainSc: 5                  Kennieth Rad

## 2021-03-14 NOTE — Anesthesia Procedure Notes (Signed)
Procedure Name: LMA Insertion Date/Time: 03/14/2021 12:35 PM Performed by: Nadara Mustard, CRNA Pre-anesthesia Checklist: Patient identified, Emergency Drugs available, Suction available and Patient being monitored Patient Re-evaluated:Patient Re-evaluated prior to induction Oxygen Delivery Method: Circle system utilized Preoxygenation: Pre-oxygenation with 100% oxygen Induction Type: IV induction Ventilation: Mask ventilation without difficulty LMA: LMA inserted LMA Size: 4.0 Tube type: Oral Number of attempts: 1 Airway Equipment and Method: Stylet and Oral airway Placement Confirmation: ETT inserted through vocal cords under direct vision, positive ETCO2 and breath sounds checked- equal and bilateral Tube secured with: Tape Dental Injury: Teeth and Oropharynx as per pre-operative assessment

## 2021-03-14 NOTE — Anesthesia Procedure Notes (Signed)
Anesthesia Regional Block: Adductor canal block   Pre-Anesthetic Checklist: , timeout performed,  Correct Patient, Correct Site, Correct Laterality,  Correct Procedure, Correct Position, site marked,  Risks and benefits discussed,  Surgical consent,  Pre-op evaluation,  At surgeon's request and post-op pain management  Laterality: Left  Prep: chloraprep       Needles:  Injection technique: Single-shot  Needle Type: Echogenic Needle     Needle Length: 9cm  Needle Gauge: 21     Additional Needles:   Procedures:,,,, ultrasound used (permanent image in chart),,    Narrative:  Start time: 03/14/2021 11:02 AM End time: 03/14/2021 11:08 AM Injection made incrementally with aspirations every 5 mL.  Performed by: Personally  Anesthesiologist: Marcene Duos, MD

## 2021-03-14 NOTE — Transfer of Care (Signed)
Immediate Anesthesia Transfer of Care Note  Patient: Nicholas Bright  Procedure(s) Performed: OPEN REDUCTION INTERNAL FIXATION (ORIF) ANKLE FRACTURE (Left: Ankle) OPEN REDUCTION INTERNAL FIXATION (ORIF) LEFT 5TH METACARPAL (Left: Finger)  Patient Location: PACU  Anesthesia Type:GA combined with regional for post-op pain  Level of Consciousness: awake  Airway & Oxygen Therapy: Patient Spontanous Breathing and Patient connected to nasal cannula oxygen  Post-op Assessment: Report given to RN and Post -op Vital signs reviewed and stable  Post vital signs: Reviewed and stable  Last Vitals:  Vitals Value Taken Time  BP 130/94 03/14/21 1341  Temp    Pulse 71 03/14/21 1345  Resp 9 03/14/21 1345  SpO2 96 % 03/14/21 1345  Vitals shown include unvalidated device data.  Last Pain:  Vitals:   03/14/21 1012  TempSrc:   PainSc: 6       Patients Stated Pain Goal: 3 (03/14/21 1012)  Complications: No notable events documented.

## 2021-03-15 ENCOUNTER — Encounter (HOSPITAL_COMMUNITY): Payer: Self-pay | Admitting: Orthopaedic Surgery

## 2021-03-18 NOTE — Therapy (Signed)
OUTPATIENT PHYSICAL THERAPY LOWER EXTREMITY EVALUATION   Patient Name: Nicholas Bright MRN: [id number] DOB:[birth year], 61 y.o., male Today's Date: 03/19/2021   PT End of Session - 03/19/21 1402     Visit Number 1    Number of Visits 21    Date for PT Re-Evaluation 06/01/21    Authorization Type BCBS- no VL    PT Start Time 1115    PT Stop Time 1200    PT Time Calculation (min) 45 min    Activity Tolerance Patient tolerated treatment well    Behavior During Therapy [facility] for tasks assessed/performed             History reviewed. No pertinent past medical history. Past Surgical History:  Procedure Laterality Date   HIP SURGERY     LUMBAR DISC SURGERY     OPEN REDUCTION INTERNAL FIXATION (ORIF) METACARPAL Left 03/14/2021   Procedure: OPEN REDUCTION INTERNAL FIXATION (ORIF) LEFT 5TH METACARPAL;  Surgeon: Sherilyn Cooter, MD;  Location: [place];  Service: Orthopedics;  Laterality: Left;   ORIF ANKLE FRACTURE Left 03/14/2021   Procedure: OPEN REDUCTION INTERNAL FIXATION (ORIF) ANKLE FRACTURE;  Surgeon: Vanetta Mulders, MD;  Location: [place];  Service: Orthopedics;  Laterality: Left;   SKIN BIOPSY Bilateral 08/17/2020   pigmented seborrheic keratosis, irritated   Patient Active Problem List   Diagnosis Date Noted   Displaced fracture of neck of fifth metacarpal bone, left hand, initial encounter for closed fracture    Displaced fracture of lateral malleolus of right fibula, initial encounter for closed fracture    Syndesmotic disruption of ankle, left, initial encounter    Fracture of metacarpal, neck 03/13/2021   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 Reports sharp pain across ankle.   PERTINENT HISTORY: H/o hip surgery  PAIN:  Are you having pain? No NPRS scale: 0/10 Pain location: left ankle Aggravating factors: grabs if moves into DF motion Relieving factors: ice   WEIGHT BEARING RESTRICTIONS Yes NWB  FALLS:  Has patient fallen in last 6 months? Yes, Number of falls: 1- accident that caused injury  LIVING ENVIRONMENT: Lives with: lives with their family Lives in: House/apartment Stairs: Yes- just a cuple to get into home Has following equipment at home: Crutches, Wheelchair (manual), and knee scooter  OCCUPATION: [employer] does not have to use stairs  PLOF: Independent  PATIENT GOALS back to normal work-life   OBJECTIVE:   PATIENT SURVEYS:  FOTO 34  COGNITION:  Overall cognitive status: Within functional limits for tasks assessed     SENSATION:  Numbness present   Skin integrity: skin was wet under bandages from ice pack leaking per pt- posterior medial heel was white on the surface and asked that they keep an eye on that area as it dries out.  LE AROM/PROM:  A/PROM Right 03/19/2021 Left 03/19/2021  Hip flexion    Hip extension    Hip abduction    Hip adduction    Hip internal rotation    Hip external rotation    Knee flexion    Knee  extension    Ankle dorsiflexion    Ankle plantarflexion    Ankle inversion    Ankle eversion     (Blank rows = not tested)  LE MMT:  MMT Right 03/19/2021 Left 03/19/2021  Hip flexion    Hip extension    Hip abduction    Hip adduction    Hip internal rotation    Hip external rotation    Knee flexion    Knee extension    Ankle dorsiflexion    Ankle plantarflexion    Ankle inversion    Ankle eversion     (Blank rows = not tested)    GAIT: ModI with bil axillary crutches today    TODAY'S TREATMENT: EVAL   PT removed  bandage on ankle- incisions are well-healing without signs of infection. Replaced with gauze over incisions and tegaderm to cover.    Discussed ice, positioning and discussed exercise handout   PATIENT EDUCATION:  Education details: Anatomy of condition, POC, HEP, exercise form/rationale  Person educated: Patient and Child(ren) Education method: Explanation, Verbal cues, and Handouts Education comprehension: verbalized understanding and needs further education   HOME EXERCISE PROGRAM: [id number]  ASSESSMENT:  CLINICAL IMPRESSION: Patient is a 61 y.o. M who was seen today for physical therapy evaluation and treatment for s/p ORIF Lt lateral malleolus.  Objective impairments include Abnormal gait, decreased activity tolerance, decreased mobility, difficulty walking, decreased ROM, decreased strength, increased edema, impaired flexibility, improper body mechanics, and pain. These impairments are limiting patient from cleaning, community activity, driving, meal prep, occupation, yard work, and shopping.  Personal factors including  Left hand surgery performed on same day  are also affecting patient's functional outcome. Patient will benefit from skilled PT to address above impairments and improve overall function.  Encouraged pt to consider a tall sock (low compression) to protect skin in the boot and to help control edema. Minimal edema was noted today but explained that this will increase and importance of elevating. He does not have a post op appt scheduled right now and will reach out. Will begin POC at 1/week for ROM and gross LE strengthening within WB restrictions and then progress as appropriate. Would likely benefit from aquatic therapy as weight bearing is progressed.   REHAB POTENTIAL: Good  CLINICAL DECISION MAKING: Stable/uncomplicated  EVALUATION COMPLEXITY: Low   GOALS: Goals reviewed with patient? Yes  SHORT TERM GOALS:  STG Name Target Date Goal status  1 Pt will demo  proper form in HEP Baseline:  04/09/2021 INITIAL  2 Pt will be independent with incision care and RICE techniques Baseline:  04/09/2021 INITIAL   LONG TERM GOALS:   LTG Name Target Date Goal status  1 Pt will demo ambulation pattern without compensation Baseline: NWB at eval 06/01/21 INITIAL  2 Pt will navigate steps and stairs without compensation Baseline: NWB at eval 06/01/21 INITIAL  3 Average pain level <=3/10 Baseline: up to severe levels when pain shoots at eval 06/01/21 INITIAL  4 Pt will return to preferred exercise program for physical fitness Baseline: unable at eval 06/01/21 INITIAL   PLAN: PT FREQUENCY: 1-2x/week  PT DURATION: 10 weeks  PLANNED INTERVENTIONS: Therapeutic exercises, Therapeutic activity, Neuro Muscular re-education, Balance training, Gait training, Patient/Family education, Joint mobilization, Stair training, Aquatic Therapy, Dry Needling, Electrical stimulation, Cryotherapy, Moist heat, scar mobilization, Taping, Vasopneumatic device, and Manual therapy  PLAN FOR NEXT SESSION: gentle ROM of ankle, review hip exercises  Jazmon Kos C. [employer] PT, DPT 03/19/21 4:41 PM

## 2021-03-19 ENCOUNTER — Other Ambulatory Visit (HOSPITAL_BASED_OUTPATIENT_CLINIC_OR_DEPARTMENT_OTHER): Payer: Self-pay | Admitting: Orthopaedic Surgery

## 2021-03-19 ENCOUNTER — Encounter (HOSPITAL_BASED_OUTPATIENT_CLINIC_OR_DEPARTMENT_OTHER): Payer: Self-pay | Admitting: Physical Therapy

## 2021-03-19 ENCOUNTER — Other Ambulatory Visit: Payer: Self-pay

## 2021-03-19 ENCOUNTER — Telehealth: Payer: Self-pay | Admitting: Orthopedic Surgery

## 2021-03-19 ENCOUNTER — Ambulatory Visit (HOSPITAL_BASED_OUTPATIENT_CLINIC_OR_DEPARTMENT_OTHER): Payer: BC Managed Care – PPO | Attending: Orthopaedic Surgery | Admitting: Physical Therapy

## 2021-03-19 DIAGNOSIS — X58XXXA Exposure to other specified factors, initial encounter: Secondary | ICD-10-CM | POA: Diagnosis not present

## 2021-03-19 DIAGNOSIS — M25672 Stiffness of left ankle, not elsewhere classified: Secondary | ICD-10-CM | POA: Insufficient documentation

## 2021-03-19 DIAGNOSIS — R600 Localized edema: Secondary | ICD-10-CM | POA: Insufficient documentation

## 2021-03-19 DIAGNOSIS — S8261XA Displaced fracture of lateral malleolus of right fibula, initial encounter for closed fracture: Secondary | ICD-10-CM | POA: Diagnosis not present

## 2021-03-19 DIAGNOSIS — M25572 Pain in left ankle and joints of left foot: Secondary | ICD-10-CM | POA: Diagnosis present

## 2021-03-19 DIAGNOSIS — R262 Difficulty in walking, not elsewhere classified: Secondary | ICD-10-CM | POA: Insufficient documentation

## 2021-03-19 MED ORDER — OXYCODONE HCL 5 MG PO CAPS: 5.0000 mg | ORAL_CAPSULE | ORAL | 0 refills | Status: DC | PRN

## 2021-03-19 NOTE — Telephone Encounter (Signed)
Patient called. He would like to know if he should be having OT? His call back number is [phone]

## 2021-03-24 ENCOUNTER — Other Ambulatory Visit (HOSPITAL_BASED_OUTPATIENT_CLINIC_OR_DEPARTMENT_OTHER): Payer: Self-pay | Admitting: Orthopaedic Surgery

## 2021-03-24 MED ORDER — OXYCODONE HCL 5 MG PO CAPS
5.0000 mg | ORAL_CAPSULE | ORAL | 0 refills | Status: DC | PRN
Start: 2021-03-24 — End: 2022-01-23

## 2021-03-26 ENCOUNTER — Encounter (HOSPITAL_BASED_OUTPATIENT_CLINIC_OR_DEPARTMENT_OTHER): Payer: Self-pay | Admitting: Physical Therapy

## 2021-03-26 ENCOUNTER — Ambulatory Visit (HOSPITAL_BASED_OUTPATIENT_CLINIC_OR_DEPARTMENT_OTHER): Payer: BC Managed Care – PPO | Admitting: Physical Therapy

## 2021-03-26 ENCOUNTER — Other Ambulatory Visit: Payer: Self-pay

## 2021-03-26 DIAGNOSIS — M25572 Pain in left ankle and joints of left foot: Secondary | ICD-10-CM | POA: Diagnosis not present

## 2021-03-26 DIAGNOSIS — R600 Localized edema: Secondary | ICD-10-CM

## 2021-03-26 DIAGNOSIS — M25672 Stiffness of left ankle, not elsewhere classified: Secondary | ICD-10-CM

## 2021-03-26 DIAGNOSIS — R262 Difficulty in walking, not elsewhere classified: Secondary | ICD-10-CM
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
## 2021-03-27 ENCOUNTER — Other Ambulatory Visit (HOSPITAL_BASED_OUTPATIENT_CLINIC_OR_DEPARTMENT_OTHER): Payer: Self-pay | Admitting: Orthopaedic Surgery

## 2021-03-27 ENCOUNTER — Ambulatory Visit: Payer: Self-pay

## 2021-03-27 ENCOUNTER — Ambulatory Visit (INDEPENDENT_AMBULATORY_CARE_PROVIDER_SITE_OTHER): Payer: BC Managed Care – PPO | Admitting: Orthopedic Surgery

## 2021-03-27 DIAGNOSIS — S6992XA Unspecified injury of left wrist, hand and finger(s), initial encounter: Secondary | ICD-10-CM

## 2021-03-27 DIAGNOSIS — S62337A Displaced fracture of neck of fifth metacarpal bone, left hand, initial encounter for closed fracture: Secondary | ICD-10-CM

## 2021-03-27 DIAGNOSIS — S93432A Sprain of tibiofibular ligament of left ankle, initial encounter: Secondary | ICD-10-CM

## 2021-03-27 NOTE — Progress Notes (Signed)
° °  Post-Op Visit Note   Patient: Nicholas Bright           Date of Birth: Apr 08, 1960           MRN: [id number] Visit Date: 03/27/2021 PCP: Denita Lung, MD   Assessment & Plan:  Chief Complaint:  Chief Complaint  Patient presents with   Left Little Finger - Routine Post Op    13 days postop   Visit Diagnoses:  1. Injury of left little finger, initial encounter   2. Displaced fracture of neck of fifth metacarpal bone, left hand, initial encounter for closed fracture     Plan: Patient is now two weeks s/p ORIF left small finger [facility] neck fracture.  He is doing well postoperatively.  X-rays show acceptable fracture alignment.  His ulnar hand is still moderately swollen.  He has limited ROM of the small finger but questionable malrotation of the small finger relative to the ring finger.  We will start him in hand therapy and I can see him back in another month for repeat x-rays.   Follow-Up Instructions: No follow-ups on file.   Orders:  Orders Placed This Encounter  Procedures   XR Hand Complete Left   No orders of the defined types were placed in this encounter.   Imaging: No results found.  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  Family History  Problem Relation Age of Onset   Congestive Heart Failure Father    CAD Father 26    Past Surgical History:  Procedure Laterality Date   HIP SURGERY     LUMBAR DISC SURGERY     OPEN REDUCTION INTERNAL FIXATION (ORIF) METACARPAL Left 03/14/2021   Procedure: OPEN REDUCTION INTERNAL FIXATION (ORIF) LEFT 5TH METACARPAL;   Surgeon: Sherilyn Cooter, MD;  Location: [place];  Service: Orthopedics;  Laterality: Left;   ORIF ANKLE FRACTURE Left 03/14/2021   Procedure: OPEN REDUCTION INTERNAL FIXATION (ORIF) ANKLE FRACTURE;  Surgeon: Vanetta Mulders, MD;  Location: [place];  Service: Orthopedics;  Laterality: Left;   SKIN BIOPSY Bilateral 08/17/2020   pigmented seborrheic keratosis, irritated   Social History   Occupational History   Not on file  Tobacco Use   Smoking status: Never   Smokeless tobacco: Never  Vaping Use   Vaping Use: Never used  Substance and Sexual Activity   Alcohol use: Yes    Alcohol/week: 10.0 standard drinks    Types: 4 Glasses of wine, 6 Cans of beer per week   Drug use: Never   Sexual activity: Yes    Partners: Female    Birth control/protection: None

## 2021-03-28 ENCOUNTER — Other Ambulatory Visit: Payer: Self-pay

## 2021-03-28 ENCOUNTER — Ambulatory Visit (INDEPENDENT_AMBULATORY_CARE_PROVIDER_SITE_OTHER): Payer: BC Managed Care – PPO | Admitting: Orthopaedic Surgery

## 2021-03-28 DIAGNOSIS — S93432A Sprain of tibiofibular ligament of left ankle, initial encounter: Secondary | ICD-10-CM

## 2021-03-28 NOTE — Progress Notes (Signed)
Post Operative Evaluation    Procedure/Date of Surgery: left ankle open reduction internal fixation 03/14/21  Interval History:   Presents status post left ankle fixation 2 weeks, overall doing extremely well. Has been compliant with non weight bearing on left ankle. Has been taking aspirin.   PMH/PSH/Family History/Social History/Meds/Allergies:   No past medical history on file. Past Surgical History:  Procedure Laterality Date   HIP SURGERY     LUMBAR DISC SURGERY     OPEN REDUCTION INTERNAL FIXATION (ORIF) METACARPAL Left 03/14/2021   Procedure: OPEN REDUCTION INTERNAL FIXATION (ORIF) LEFT 5TH METACARPAL;  Surgeon: Marlyne Beards, MD;  [facility];  Service: Orthopedics;  Laterality: Left;   ORIF ANKLE FRACTURE Left 03/14/2021   Procedure: OPEN REDUCTION INTERNAL FIXATION (ORIF) ANKLE FRACTURE;  Surgeon: Huel Cote, MD;  [facility];  Service: Orthopedics;  Laterality: Left;   SKIN BIOPSY Bilateral 08/17/2020   pigmented seborrheic keratosis, irritated   Social History   Socioeconomic History   Marital status: Single    Spouse name: Not on file   Number of children: Not on file   Years of education: Not on file   Highest education level: Not on file  Occupational History   Not on file  Tobacco Use   Smoking status: Never   Smokeless tobacco: Never  Vaping Use   Vaping Use: Never used  Substance and Sexual Activity   Alcohol use: Yes    Alcohol/week: 10.0 standard drinks    Types: 4 Glasses of wine, 6 Cans of beer per week   Drug use: Never   Sexual activity: Yes    Partners: Female    Birth control/protection: None  Other Topics Concern   Not on file  Social History Narrative   Single, 3 children.  He is the Runner, broadcasting/film/video for [employer].   Social Determinants of Health   Financial Resource Strain: Not on file  Food Insecurity: Not on file  Transportation Needs: Not on file  Physical Activity: Not on file   Stress: Not on file  Social Connections: Not on file   Family History  Problem Relation Age of Onset   Congestive Heart Failure Father    CAD Father 19   No Known Allergies Current Outpatient Medications  Medication Sig Dispense Refill   oxycodone (OXY-IR) 5 MG capsule Take 1 capsule (5 mg total) by mouth every 4 (four) hours as needed (severe pain). 10 capsule 0   aspirin EC 325 MG tablet Take 1 tablet (325 mg total) by mouth daily. 30 tablet 0   Cholecalciferol (VITAMIN D3 PO) Take 1 tablet by mouth 4 (four) times a week.     Cyanocobalamin (VITAMIN B-12 PO) Take 1 tablet by mouth 4 (four) times a week.     Multiple Vitamin (MULTIVITAMIN WITH MINERALS) TABS tablet Take 1 tablet by mouth 4 (four) times a week.     Multiple Vitamins-Minerals (AIRBORNE PO) Take 1 tablet by mouth daily as needed (with travels/flying).     Multiple Vitamins-Minerals (PRESERVISION AREDS 2 PO) Take 1 tablet by mouth 4 (four) times a week.     Multiple Vitamins-Minerals (ZINC PO) Take 1 tablet by mouth 4 (four) times a week.     oxycodone (OXY-IR) 5 MG capsule Take 1 capsule (5 mg total) by mouth every 4 (four) hours  as needed (severe pain). 20 capsule 0   No current facility-administered medications for this visit.   No results found.  Review of Systems:   A ROS was performed including pertinent positives and negatives as documented in the HPI.   Musculoskeletal Exam:    There were no vitals taken for this visit.  Left ankle incisions are well healed. Trace swelling about ankle. SILT all distributions. 2+ DP.  Imaging:      I personally reviewed and interpreted the radiographs.   Assessment:   61 year old male 2 weeks status post left ankle open reduction reduction internal fixation, doing extremely well. At this time I would like him to advance his weight bearing as tolerated. He will use his boot for 2 additional weeks and wean out at that point. Plan :    - Return to clinic in 4  weeks    I personally saw and evaluated the patient, and participated in the management and treatment plan.  Huel Cote, MD Attending Physician, Orthopedic Surgery  This document was dictated using Dragon voice recognition software. A reasonable attempt at proof reading has been made to minimize errors.

## 2021-04-02 ENCOUNTER — Encounter: Payer: Self-pay | Admitting: Rehabilitative and Restorative Service Providers"

## 2021-04-02 ENCOUNTER — Ambulatory Visit (INDEPENDENT_AMBULATORY_CARE_PROVIDER_SITE_OTHER): Payer: BC Managed Care – PPO | Admitting: Rehabilitative and Restorative Service Providers"

## 2021-04-02 ENCOUNTER — Other Ambulatory Visit: Payer: Self-pay

## 2021-04-02 DIAGNOSIS — R278 Other lack of coordination: Secondary | ICD-10-CM | POA: Diagnosis not present

## 2021-04-02 DIAGNOSIS — M79642 Pain in left hand: Secondary | ICD-10-CM

## 2021-04-02 DIAGNOSIS — M6281 Muscle weakness (generalized): Secondary | ICD-10-CM

## 2021-04-02 DIAGNOSIS — M25642 Stiffness of left hand, not elsewhere classified: Secondary | ICD-10-CM
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
## 2021-04-04 ENCOUNTER — Encounter (HOSPITAL_BASED_OUTPATIENT_CLINIC_OR_DEPARTMENT_OTHER): Payer: Self-pay | Admitting: Physical Therapy

## 2021-04-04 ENCOUNTER — Other Ambulatory Visit: Payer: Self-pay

## 2021-04-04 ENCOUNTER — Ambulatory Visit (HOSPITAL_BASED_OUTPATIENT_CLINIC_OR_DEPARTMENT_OTHER): Payer: BC Managed Care – PPO | Attending: Orthopaedic Surgery | Admitting: Physical Therapy

## 2021-04-04 DIAGNOSIS — R600 Localized edema: Secondary | ICD-10-CM | POA: Diagnosis present

## 2021-04-04 DIAGNOSIS — M6281 Muscle weakness (generalized): Secondary | ICD-10-CM | POA: Insufficient documentation

## 2021-04-04 DIAGNOSIS — M25572 Pain in left ankle and joints of left foot: Secondary | ICD-10-CM | POA: Diagnosis present

## 2021-04-04 DIAGNOSIS — R262 Difficulty in walking, not elsewhere classified: Secondary | ICD-10-CM | POA: Insufficient documentation

## 2021-04-04 DIAGNOSIS — M25672 Stiffness of left ankle, not elsewhere classified: Secondary | ICD-10-CM | POA: Diagnosis present

## 2021-04-04 NOTE — Therapy (Signed)
OUTPATIENT PHYSICAL THERAPY TREATMENT   Patient Name: Nicholas Bright MRN: [id number] DOB:03-07-1960, 61 y.o., male Today's Date: 04/04/2021   PT End of Session - 04/04/21 0843     Visit Number 3    Number of Visits 21    Date for PT Re-Evaluation 06/01/21    Authorization Type BCBS- no VL    PT Start Time 0805    PT Stop Time 0840    PT Time Calculation (min) 35 min    Activity Tolerance Patient tolerated treatment well    Behavior During Therapy [facility] for tasks assessed/performed               History reviewed. No pertinent past medical history. Past Surgical History:  Procedure Laterality Date   HIP SURGERY     LUMBAR DISC SURGERY     OPEN REDUCTION INTERNAL FIXATION (ORIF) METACARPAL Left 03/14/2021   Procedure: OPEN REDUCTION INTERNAL FIXATION (ORIF) LEFT 5TH METACARPAL;  Surgeon: Sherilyn Cooter, MD;  Location: [place];  Service: Orthopedics;  Laterality: Left;   ORIF ANKLE FRACTURE Left 03/14/2021   Procedure: OPEN REDUCTION INTERNAL FIXATION (ORIF) ANKLE FRACTURE;  Surgeon: Vanetta Mulders, MD;  Location: [place];  Service: Orthopedics;  Laterality: Left;   SKIN BIOPSY Bilateral 08/17/2020   pigmented seborrheic keratosis, irritated   Patient Active Problem List   Diagnosis Date Noted   Displaced fracture of neck of fifth metacarpal bone, left hand, initial encounter for closed fracture    Displaced fracture of lateral malleolus of right fibula, initial encounter for closed fracture    Syndesmotic disruption of ankle, left, initial encounter    Fracture of metacarpal, neck 03/13/2021   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 He is WBAT at this time. He feels some more swelling at this time.   PERTINENT HISTORY: H/o hip surgery  PAIN:  Are you having pain? No NPRS scale: 0/10 Pain location: left ankle Aggravating factors: grabs if moves into DF motion Relieving factors: ice   WEIGHT BEARING RESTRICTIONS Yes NWB  FALLS:  Has patient fallen in last 6 months? Yes, Number of falls: 1- accident that caused injury  LIVING ENVIRONMENT: Lives with: lives with their family Lives in: House/apartment Stairs: Yes- just a cuple to get into home Has following equipment at home: Crutches, Wheelchair (manual), and knee scooter  OCCUPATION: [employer]  PLOF: Independent  PATIENT GOALS back to normal work-life   OBJECTIVE:   L ankle AROM DF -10 deg; PF 46 deg  PATIENT SURVEYS:  FOTO 34   TODAY'S TREATMENT:  Exercise:  Ankle AROM PF, DF, IV, EV 20x  Standing weight shift at table in boot 5s 20x Standing march 20x STS 2 x15 from table   Gait training: single crutch with cuing for sequencing and safety on level ground and stairs 3 steps x2  Bandages changed- steri strips replaced-  incisions clean and dry without minor clear drainage, no erythema noted Edema into dorsum of foot and toes   PATIENT EDUCATION:  Education details: anatomy, exercise progression, precautions,  muscle firing,  envelope of function, HEP, POC Person educated: Patient and Child(ren) Education method: Explanation, Verbal cues, and Handouts Education comprehension: verbalized understanding and needs further education   HOME EXERCISE PROGRAM: [id number]  ASSESSMENT:  CLINICAL IMPRESSION: Per last MD note, pt is WBAT at this time. Pt progressed to single crutch gait with good tolerance to WB at today's session with roughly 80% during standing weight shifting exercise. Pt with minor medial  pain near tightwire incision site. Pt HEP updated to include STS exercise. Plan to continue per protocol.    Objective impairments include Abnormal gait, decreased activity tolerance, decreased mobility, difficulty walking, decreased ROM, decreased strength, increased edema, impaired flexibility, improper body mechanics, and pain. These impairments are limiting patient from cleaning, community activity, driving, meal prep, occupation, yard work, and shopping.  Personal factors including  Left hand surgery performed on same day  are also affecting patient's functional outcome. Patient will benefit from skilled PT to address above impairments and improve overall function.   REHAB POTENTIAL: Good  CLINICAL DECISION MAKING: Stable/uncomplicated  EVALUATION COMPLEXITY: Low   GOALS:   SHORT TERM GOALS:  STG Name Target Date Goal status  1 Pt will demo proper form in HEP Baseline:  04/25/2021 INITIAL  2 Pt will be independent with incision care and RICE techniques Baseline:  04/25/2021 INITIAL   LONG TERM GOALS:   LTG Name Target Date Goal status  1 Pt will demo ambulation pattern without compensation Baseline: NWB at eval 06/01/21 INITIAL  2 Pt will navigate steps and stairs without compensation Baseline: NWB at eval 06/01/21 INITIAL  3 Average pain level <=3/10 Baseline: up to severe levels when pain shoots at eval 06/01/21 INITIAL  4 Pt will return to preferred exercise program for physical fitness Baseline: unable at eval 06/01/21 INITIAL   PLAN: PT FREQUENCY: 1-2x/week  PT DURATION: 10 weeks  PLANNED INTERVENTIONS: Therapeutic exercises, Therapeutic activity, Neuro Muscular re-education, Balance training, Gait training, Patient/Family education, Joint mobilization, Stair training, Aquatic Therapy, Dry Needling, Electrical stimulation, Cryotherapy, Moist heat, scar mobilization, Taping, Vasopneumatic device, and Manual therapy  PLAN FOR NEXT SESSION: gentle ROM of ankle, review hip  exercises  Daleen Bo PT, DPT 04/04/21 8:45 AM

## 2021-04-10 ENCOUNTER — Encounter: Payer: BC Managed Care – PPO | Admitting: Rehabilitative and Restorative Service Providers"
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
## 2021-04-11 ENCOUNTER — Ambulatory Visit (HOSPITAL_BASED_OUTPATIENT_CLINIC_OR_DEPARTMENT_OTHER): Payer: BC Managed Care – PPO | Admitting: Physical Therapy

## 2021-04-11 ENCOUNTER — Other Ambulatory Visit: Payer: Self-pay

## 2021-04-11 ENCOUNTER — Encounter (HOSPITAL_BASED_OUTPATIENT_CLINIC_OR_DEPARTMENT_OTHER): Payer: Self-pay | Admitting: Physical Therapy

## 2021-04-11 DIAGNOSIS — R262 Difficulty in walking, not elsewhere classified: Secondary | ICD-10-CM

## 2021-04-11 DIAGNOSIS — M25672 Stiffness of left ankle, not elsewhere classified: Secondary | ICD-10-CM

## 2021-04-11 DIAGNOSIS — R600 Localized edema: Secondary | ICD-10-CM

## 2021-04-11 DIAGNOSIS — M6281 Muscle weakness (generalized): Secondary | ICD-10-CM

## 2021-04-11 DIAGNOSIS — M25572 Pain in left ankle and joints of left foot: Secondary | ICD-10-CM

## 2021-04-11 NOTE — Therapy (Signed)
OUTPATIENT PHYSICAL THERAPY TREATMENT   Patient Name: Nicholas Bright MRN: [id number] DOB:[birth year], 61 y.o., male Today's Date: 04/11/2021   PT End of Session - 04/11/21 0842     Visit Number 4    Number of Visits 21    Date for PT Re-Evaluation 06/01/21    Authorization Type BCBS- no VL    PT Start Time 0805    PT Stop Time 0835   pt requests to finish early for later appt.   PT Time Calculation (min) 30 min    Activity Tolerance Patient tolerated treatment well    Behavior During Therapy [facility] for tasks assessed/performed                History reviewed. No pertinent past medical history. Past Surgical History:  Procedure Laterality Date   HIP SURGERY     LUMBAR DISC SURGERY     OPEN REDUCTION INTERNAL FIXATION (ORIF) METACARPAL Left 03/14/2021   Procedure: OPEN REDUCTION INTERNAL FIXATION (ORIF) LEFT 5TH METACARPAL;  Surgeon: Marlyne Beards, MD;  Location: [facility];  Service: Orthopedics;  Laterality: Left;   ORIF ANKLE FRACTURE Left 03/14/2021   Procedure: OPEN REDUCTION INTERNAL FIXATION (ORIF) ANKLE FRACTURE;  Surgeon: Huel Cote, MD;  Location: [facility];  Service: Orthopedics;  Laterality: Left;   SKIN BIOPSY Bilateral 08/17/2020   pigmented seborrheic keratosis, irritated   Patient Active Problem List   Diagnosis Date Noted   Displaced fracture of neck of fifth metacarpal bone, left hand, initial encounter for closed fracture    Displaced fracture of lateral malleolus of right fibula, initial encounter for closed fracture    Syndesmotic disruption of ankle, left, initial encounter    Fracture of metacarpal, neck 03/13/2021   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 He has been walking on it and icing it twice a day.   PERTINENT HISTORY: H/o hip surgery  PAIN:  Are you having pain? No NPRS scale: 0/10 Pain location: left ankle Aggravating factors: grabs if moves into DF motion Relieving factors: ice   WEIGHT BEARING RESTRICTIONS Yes NWB  FALLS:  Has patient fallen in last 6 months? Yes, Number of falls: 1- accident that caused injury  LIVING ENVIRONMENT: Lives with: lives with their family Lives in: House/apartment Stairs: Yes- just a cuple to get into home Has following equipment at home: Crutches, Wheelchair (manual), and knee scooter  OCCUPATION: [employer]  PLOF: Independent  PATIENT GOALS back to normal work-life   OBJECTIVE:   L ankle AROM DF 2 deg in CKC  PATIENT SURVEYS:  FOTO 34   TODAY'S TREATMENT:  Exercise:  L1 Nutstep  Heel toe rocking 20x STS 2 x15 from table Seated ankle DF 10s 10x Side stepping 4x table laps Towel scrunches 2 min   Bandages changed-  incisions clean and dry without minor clear drainage, no erythema noted Edema into dorsum of foot and toes   PATIENT EDUCATION:  Education details: anatomy, exercise progression, precautions, muscle firing,  envelope of function, HEP, POC Person educated: Patient  Education method: Explanation, Verbal cues, and Handouts Education comprehension: verbalized understanding and needs further education   HOME EXERCISE PROGRAM: [id number]  ASSESSMENT:  CLINICAL IMPRESSION: Pt able to continue with WB progression at this time without pain. Pt advised to begin weaning off of boot and crutch. Pt to wear boot when outside or when walking long distance at work- only wearing tennis shoe at home or on level surfaces. Pt able to FWB in clinic today without pain. Pt with  good tolerance to CKC and progression of R ankle DF. Pt HEP updated at this time. Pt has very well managed pain and is doing well with rehab. Plan to continue per protocol.    Objective impairments include Abnormal gait, decreased activity tolerance, decreased mobility, difficulty walking, decreased ROM, decreased strength, increased edema, impaired flexibility, improper body mechanics, and pain. These impairments are limiting patient from cleaning, community activity, driving, meal prep, occupation, yard work, and shopping.  Personal factors including  Left hand surgery performed on same day  are also affecting patient's functional outcome. Patient will benefit from skilled PT to address above impairments and improve overall function.   REHAB POTENTIAL: Good  CLINICAL DECISION MAKING: Stable/uncomplicated  EVALUATION COMPLEXITY: Low   GOALS:   SHORT TERM GOALS:  STG Name Target Date Goal status  1 Pt will demo proper form in HEP Baseline:  05/02/2021 INITIAL  2 Pt will be independent with incision care and RICE techniques Baseline:  05/02/2021 INITIAL   LONG TERM GOALS:   LTG Name Target Date Goal status  1 Pt will demo ambulation pattern without compensation Baseline: NWB at eval 06/01/21 INITIAL  2 Pt will navigate steps and stairs without compensation Baseline: NWB at eval 06/01/21 INITIAL  3 Average pain level <=3/10 Baseline: up to severe levels when pain shoots at eval 06/01/21 INITIAL  4 Pt will return to preferred exercise program for physical fitness Baseline: unable at eval 06/01/21 INITIAL   PLAN: PT FREQUENCY: 1-2x/week  PT DURATION: 10 weeks  PLANNED INTERVENTIONS: Therapeutic exercises, Therapeutic activity, Neuro Muscular re-education, Balance training, Gait training, Patient/Family education, Joint mobilization, Stair training, Aquatic Therapy, Dry Needling, Electrical stimulation, Cryotherapy, Moist heat, scar mobilization, Taping, Vasopneumatic device, and Manual  therapy  PLAN FOR NEXT SESSION: ROM of ankle, review hip exercises  Zebedee Iba PT, DPT 04/11/21 8:47 AM

## 2021-04-17 ENCOUNTER — Encounter: Payer: Self-pay | Admitting: Rehabilitative and Restorative Service Providers"

## 2021-04-17 ENCOUNTER — Ambulatory Visit: Payer: BC Managed Care – PPO | Admitting: Rehabilitative and Restorative Service Providers"

## 2021-04-17 ENCOUNTER — Other Ambulatory Visit: Payer: Self-pay

## 2021-04-17 DIAGNOSIS — M25642 Stiffness of left hand, not elsewhere classified: Secondary | ICD-10-CM

## 2021-04-17 DIAGNOSIS — M79642 Pain in left hand: Secondary | ICD-10-CM
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
## 2021-04-18 ENCOUNTER — Encounter (HOSPITAL_BASED_OUTPATIENT_CLINIC_OR_DEPARTMENT_OTHER): Payer: Self-pay | Admitting: Physical Therapy

## 2021-04-18 ENCOUNTER — Ambulatory Visit (HOSPITAL_BASED_OUTPATIENT_CLINIC_OR_DEPARTMENT_OTHER): Payer: BC Managed Care – PPO | Admitting: Physical Therapy

## 2021-04-18 DIAGNOSIS — M25572 Pain in left ankle and joints of left foot: Secondary | ICD-10-CM

## 2021-04-18 DIAGNOSIS — M6281 Muscle weakness (generalized): Secondary | ICD-10-CM

## 2021-04-18 DIAGNOSIS — M25672 Stiffness of left ankle, not elsewhere classified: Secondary | ICD-10-CM

## 2021-04-18 DIAGNOSIS — R262 Difficulty in walking, not elsewhere classified: Secondary | ICD-10-CM

## 2021-04-18 NOTE — Therapy (Signed)
OUTPATIENT PHYSICAL THERAPY TREATMENT   Patient Name: Nicholas Bright MRN: [id number] DOB:07-Jan-1961, 61 y.o., male Today's Date: 04/18/2021   PT End of Session - 04/18/21 0813     Visit Number 5    Number of Visits 21    Date for PT Re-Evaluation 06/01/21    Authorization Type BCBS- no VL    PT Start Time 0803    PT Stop Time 0841    PT Time Calculation (min) 38 min    Activity Tolerance Patient tolerated treatment well    Behavior During Therapy [facility] for tasks assessed/performed                 History reviewed. No pertinent past medical history. Past Surgical History:  Procedure Laterality Date   HIP SURGERY     LUMBAR DISC SURGERY     OPEN REDUCTION INTERNAL FIXATION (ORIF) METACARPAL Left 03/14/2021   Procedure: OPEN REDUCTION INTERNAL FIXATION (ORIF) LEFT 5TH METACARPAL;  Surgeon: Marlyne Beards, MD;  Location: [facility] OR;  Service: Orthopedics;  Laterality: Left;   ORIF ANKLE FRACTURE Left 03/14/2021   Procedure: OPEN REDUCTION INTERNAL FIXATION (ORIF) ANKLE FRACTURE;  Surgeon: Huel Cote, MD;  Location: [facility];  Service: Orthopedics;  Laterality: Left;   SKIN BIOPSY Bilateral 08/17/2020   pigmented seborrheic keratosis, irritated   Patient Active Problem List   Diagnosis Date Noted   Displaced fracture of neck of fifth metacarpal bone, left hand, initial encounter for closed fracture    Displaced fracture of lateral malleolus of right fibula, initial encounter for closed fracture    Syndesmotic disruption of ankle, left, initial encounter    Fracture of metacarpal, neck 03/13/2021   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 He is using the scooter for longer distances at work just for ease. He   PERTINENT HISTORY: H/o hip surgery  PAIN:  Are you having pain? No NPRS scale: 0/10 Pain location: left ankle Aggravating factors: grabs if moves into DF motion Relieving factors: ice   WEIGHT BEARING RESTRICTIONS Yes NWB  FALLS:  Has patient fallen in last 6 months? Yes, Number of falls: 1- accident that caused injury  LIVING ENVIRONMENT: Lives with: lives with their family Lives in: House/apartment Stairs: Yes- just a cuple to get into home Has following equipment at home: Crutches, Wheelchair (manual), and knee scooter  OCCUPATION: [employer]  PLOF: Independent  PATIENT GOALS back to normal work-life   OBJECTIVE:   L ankle DF   PATIENT SURVEYS:  FOTO 34   TODAY'S TREATMENT:  Exercise:  L1 Nutstep  Heel toe rocking 20x no boot STS 2 x15 from table Seated ankle DF 10s 10x Side stepping 4x table laps Standing march 20x no boot Standing gastroc stretch at table 20s 4x       PATIENT EDUCATION:  Education details: anatomy, exercise progression, precautions, muscle firing,  envelope of function, HEP, POC Person educated: Patient  Education method: Explanation, Verbal cues, and Handouts Education comprehension: verbalized understanding and needs further education   HOME EXERCISE PROGRAM: Access  Code: [email or web address] URL: [email or web address] Date: 04/18/2021 Prepared by: Zebedee Iba  Exercises Towel Scrunches - 2 x daily - 7 x weekly - 2 sets - 20 reps Sit to Stand with Arms Crossed - 2 x daily - 7 x weekly - 3 sets - 10 reps Heel toe Rocking - 2 x daily - 7 x weekly - 2 sets - 20 reps Side Stepping with Counter Support - 2 x daily - 7 x weekly - 1 sets - 4 reps Seated Ankle Dorsiflexion Stretch - 2 x daily - 7 x weekly - 1 sets - 10  reps - 10 hold Standing March - 2 x daily - 7 x weekly - 2 sets - 10 reps   ASSESSMENT:  CLINICAL IMPRESSION: Pt is 5 weeks at this time. Pt able to continue with progression of WB of L LE. Pt able to progress increase L LE stance time as well as gentle triceps surae activation in CKC. Pt advised to begin weaning out of boot with indoor/level surface walking. Pt demonstrates good stability with gait at this time and has very well controlled pain. Plan to continue with progression of ROM and WB tolerance with regular mobility tasks.    Objective impairments include Abnormal gait, decreased activity tolerance, decreased mobility, difficulty walking, decreased ROM, decreased strength, increased edema, impaired flexibility, improper body mechanics, and pain. These impairments are limiting patient from cleaning, community activity, driving, meal prep, occupation, yard work, and shopping.  Personal factors including  Left hand surgery performed on same day  are also affecting patient's functional outcome. Patient will benefit from skilled PT to address above impairments and improve overall function.   REHAB POTENTIAL: Good  CLINICAL DECISION MAKING: Stable/uncomplicated  EVALUATION COMPLEXITY: Low   GOALS:   SHORT TERM GOALS:  STG Name Target Date Goal status  1 Pt will demo proper form in HEP Baseline:  05/09/2021 INITIAL  2 Pt will be independent with incision care and RICE techniques Baseline:  05/09/2021 INITIAL   LONG TERM GOALS:   LTG Name Target Date Goal status  1 Pt will demo ambulation pattern without compensation Baseline: NWB at eval 06/01/21 INITIAL  2 Pt will navigate steps and stairs without compensation Baseline: NWB at eval 06/01/21 INITIAL  3 Average pain level <=3/10 Baseline: up to severe levels when pain shoots at eval 06/01/21 INITIAL  4 Pt will return to preferred exercise program for physical fitness Baseline: unable at eval 06/01/21 INITIAL   PLAN: PT FREQUENCY:  1-2x/week  PT DURATION: 10 weeks  PLANNED INTERVENTIONS: Therapeutic exercises, Therapeutic activity, Neuro Muscular re-education, Balance training, Gait training, Patient/Family education, Joint mobilization, Stair training, Aquatic Therapy, Dry Needling, Electrical stimulation, Cryotherapy, Moist heat, scar mobilization, Taping, Vasopneumatic device, and Manual therapy  PLAN FOR NEXT SESSION: ROM of ankle, review hip exercises  Zebedee Iba PT, DPT 04/18/21 8:41 AM

## 2021-04-23 ENCOUNTER — Ambulatory Visit (INDEPENDENT_AMBULATORY_CARE_PROVIDER_SITE_OTHER): Payer: BC Managed Care – PPO | Admitting: Orthopaedic Surgery

## 2021-04-23 ENCOUNTER — Ambulatory Visit (HOSPITAL_BASED_OUTPATIENT_CLINIC_OR_DEPARTMENT_OTHER)
Admission: RE | Admit: 2021-04-23 | Discharge: 2021-04-23 | Disposition: A | Discharge status: Home / Self Care | Payer: BC Managed Care – PPO | Source: Ambulatory Visit | Attending: Orthopaedic Surgery | Admitting: Orthopaedic Surgery

## 2021-04-23 ENCOUNTER — Other Ambulatory Visit: Payer: Self-pay

## 2021-04-23 DIAGNOSIS — S93432A Sprain of tibiofibular ligament of left ankle, initial encounter: Secondary | ICD-10-CM

## 2021-04-23 NOTE — Progress Notes (Signed)
Post Operative Evaluation    Procedure/Date of Surgery: left ankle open reduction internal fixation 03/14/21  Interval History:   Presents today 6 weeks follow-up after the above procedure.  He is doing very well.  He has been able to walk with the boot.  He is not using any gait devices.  Just complaining of some tightness about the medial anchor.   PMH/PSH/Family History/Social History/Meds/Allergies:   No past medical history on file. Past Surgical History:  Procedure Laterality Date   HIP SURGERY     LUMBAR DISC SURGERY     OPEN REDUCTION INTERNAL FIXATION (ORIF) METACARPAL Left 03/14/2021   Procedure: OPEN REDUCTION INTERNAL FIXATION (ORIF) LEFT 5TH METACARPAL;  Surgeon: Sherilyn Cooter, MD;  Location: [place];  Service: Orthopedics;  Laterality: Left;   ORIF ANKLE FRACTURE Left 03/14/2021   Procedure: OPEN REDUCTION INTERNAL FIXATION (ORIF) ANKLE FRACTURE;  Surgeon: Vanetta Mulders, MD;  Location: [place];  Service: Orthopedics;  Laterality: Left;   SKIN BIOPSY Bilateral 08/17/2020   pigmented seborrheic keratosis, irritated   Social History   Socioeconomic History   Marital status: Single    Spouse name: Not on file   Number of children: Not on file   Years of education: Not on file   Highest education level: Not on file  Occupational History   Not on file  Tobacco Use   Smoking status: Never   Smokeless tobacco: Never  Vaping Use   Vaping Use: Never used  Substance and Sexual Activity   Alcohol use: Yes    Alcohol/week: 10.0 standard drinks    Types: 4 Glasses of wine, 6 Cans of beer per week   Drug use: Never   Sexual activity: Yes    Partners: Female    Birth control/protection: None  Other Topics Concern   Not on file  Social History Narrative   Single, 3 children.  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   Social Determinants of Health   Financial Resource Strain: Not on file  Food Insecurity: Not on file  Transportation  Needs: Not on file  Physical Activity: Not on file  Stress: Not on file  Social Connections: Not on file   Family History  Problem Relation Age of Onset   Congestive Heart Failure Father    CAD Father 73   No Known Allergies Current Outpatient Medications  Medication Sig Dispense Refill   oxycodone (OXY-IR) 5 MG capsule Take 1 capsule (5 mg total) by mouth every 4 (four) hours as needed (severe pain). 10 capsule 0   aspirin EC 325 MG tablet Take 1 tablet (325 mg total) by mouth daily. 30 tablet 0   Cholecalciferol (VITAMIN D3 PO) Take 1 tablet by mouth 4 (four) times a week.     Cyanocobalamin (VITAMIN B-12 PO) Take 1 tablet by mouth 4 (four) times a week.     Multiple Vitamin (MULTIVITAMIN WITH MINERALS) TABS tablet Take 1 tablet by mouth 4 (four) times a week.     Multiple Vitamins-Minerals (AIRBORNE PO) Take 1 tablet by mouth daily as needed (with travels/flying).     Multiple Vitamins-Minerals (PRESERVISION AREDS 2 PO) Take 1 tablet by mouth 4 (four) times a week.     Multiple Vitamins-Minerals (ZINC PO) Take 1 tablet by mouth 4 (four) times a week.  oxycodone (OXY-IR) 5 MG capsule Take 1 capsule (5 mg total) by mouth every 4 (four) hours as needed (severe pain). 20 capsule 0   No current facility-administered medications for this visit.   No results found.  Review of Systems:   A ROS was performed including pertinent positives and negatives as documented in the HPI.   Musculoskeletal Exam:    There were no vitals taken for this visit.  Left ankle incisions are well healed. Trace swelling about ankle. SILT all distributions. 2+ DP.  Imaging:    X-rays left ankle 3 views: Status post open ankle reduction internal fixation with increased fibular healing.  Congruent syndesmosis and mortise  I personally reviewed and interpreted the radiographs.   Assessment:   61 year old male 6 weeks status post left open ankle reduction internal fixation doing extremely well.  At  this time I would like him to advance out of his boot as tolerated.  I have advised him on some scar massage about his medial counterincision around the tight rope.  He will work on this.  I will see him back in 6 weeks Plan :    - Return to clinic in 6 weeks    I personally saw and evaluated the patient, and participated in the management and treatment plan.  Vanetta Mulders, MD Attending Physician, Orthopedic Surgery  This document was dictated using Dragon voice recognition software. A reasonable attempt at proof reading has been made to minimize errors.

## 2021-04-25 ENCOUNTER — Encounter (HOSPITAL_BASED_OUTPATIENT_CLINIC_OR_DEPARTMENT_OTHER): Payer: BC Managed Care – PPO | Admitting: Orthopaedic Surgery

## 2021-05-07 ENCOUNTER — Other Ambulatory Visit: Payer: Self-pay

## 2021-05-07 ENCOUNTER — Encounter (HOSPITAL_BASED_OUTPATIENT_CLINIC_OR_DEPARTMENT_OTHER): Payer: Self-pay | Admitting: Physical Therapy

## 2021-05-07 ENCOUNTER — Ambulatory Visit (HOSPITAL_BASED_OUTPATIENT_CLINIC_OR_DEPARTMENT_OTHER): Payer: BC Managed Care – PPO | Attending: Orthopaedic Surgery | Admitting: Physical Therapy

## 2021-05-07 ENCOUNTER — Ambulatory Visit: Payer: BC Managed Care – PPO | Admitting: Rehabilitative and Restorative Service Providers"

## 2021-05-07 DIAGNOSIS — M25672 Stiffness of left ankle, not elsewhere classified: Secondary | ICD-10-CM | POA: Insufficient documentation

## 2021-05-07 DIAGNOSIS — R600 Localized edema: Secondary | ICD-10-CM

## 2021-05-07 DIAGNOSIS — R262 Difficulty in walking, not elsewhere classified: Secondary | ICD-10-CM | POA: Insufficient documentation

## 2021-05-07 DIAGNOSIS — M25642 Stiffness of left hand, not elsewhere classified: Secondary | ICD-10-CM

## 2021-05-07 DIAGNOSIS — M6281 Muscle weakness (generalized): Secondary | ICD-10-CM | POA: Insufficient documentation

## 2021-05-07 DIAGNOSIS — R278 Other lack of coordination: Secondary | ICD-10-CM

## 2021-05-07 DIAGNOSIS — M25572 Pain in left ankle and joints of left foot: Secondary | ICD-10-CM | POA: Insufficient documentation

## 2021-05-07 DIAGNOSIS — M79642 Pain in left hand: Secondary | ICD-10-CM
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
## 2021-05-07 NOTE — Therapy (Addendum)
OUTPATIENT PHYSICAL THERAPY TREATMENT   Patient Name: Nicholas Bright MRN: [id number] DOB:[birth year], 61 y.o., male Today's Date: 05/07/2021   PT End of Session - 05/07/21 0845     Visit Number 6    Number of Visits 21    Date for PT Re-Evaluation 06/01/21    Authorization Type BCBS- no VL    PT Start Time 0844    PT Stop Time 0919    PT Time Calculation (min) 35 min    Activity Tolerance Patient tolerated treatment well    Behavior During Therapy [facility] for tasks assessed/performed                 History reviewed. No pertinent past medical history. Past Surgical History:  Procedure Laterality Date   HIP SURGERY     LUMBAR DISC SURGERY     OPEN REDUCTION INTERNAL FIXATION (ORIF) METACARPAL Left 03/14/2021   Procedure: OPEN REDUCTION INTERNAL FIXATION (ORIF) LEFT 5TH METACARPAL;  Surgeon: Sherilyn Cooter, MD;  Location: [facility];  Service: Orthopedics;  Laterality: Left;   ORIF ANKLE FRACTURE Left 03/14/2021   Procedure: OPEN REDUCTION INTERNAL FIXATION (ORIF) ANKLE FRACTURE;  Surgeon: Vanetta Mulders, MD;  Location: [facility];  Service: Orthopedics;  Laterality: Left;   SKIN BIOPSY Bilateral 08/17/2020   pigmented seborrheic keratosis, irritated   Patient Active Problem List   Diagnosis Date Noted   Displaced fracture of neck of fifth metacarpal bone, left hand, initial encounter for closed fracture    Displaced fracture of lateral malleolus of right fibula, initial encounter for closed fracture    Syndesmotic disruption of ankle, left, initial encounter    Fracture of metacarpal, neck 03/13/2021   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   PERTINENT HISTORY: H/o hip surgery  PAIN:  Are you having pain? Yes- 4/10 NPRS scale: 4/10 Pain location: left ankle, medial aspect Aggravating factors: grabs if moves into DF motion Relieving factors: ice   WEIGHT BEARING RESTRICTIONS Yes NWB  FALLS:  Has patient fallen in last 6 months? Yes, Number of falls: 1- accident that caused injury  LIVING ENVIRONMENT: Lives with: lives with their family Lives in: House/apartment Stairs: Yes- just a cuple to get into home Has following equipment at home: Crutches, Wheelchair (manual), and knee scooter  OCCUPATION: [employer]  PLOF: Independent  PATIENT GOALS back to normal work-life   OBJECTIVE:     3/13: L ankle DF 0 passive and active  PATIENT SURVEYS:  FOTO 34   TODAY'S TREATMENT: 3/13: MANUAL- edema mob around bil malleolus, fascia mobilization medial and lateral at incision and over medial malleolus, Joint mobs- distraction, AP grade 4, manual DF stretching Gait- allowing DF in stance phase, toe off. Fwd & retro stepping   Exercise:  L1 97mn Nutstep  Heel toe rocking 20x no boot STS 2 x15 from table Seated ankle DF 10s 10x Side stepping 4x table laps Standing march 20x no boot Standing gastroc stretch at table 20s 4x       PATIENT  EDUCATION:  Education details: manual therapy & progression of gait Person educated: Patient  Education method: Explanation, Verbal cues, and Handouts Education comprehension: verbalized understanding and needs further education   HOME EXERCISE PROGRAM: Access Code: [email or web address] URL: [email or web address]      ASSESSMENT:  CLINICAL IMPRESSION: Arrived without boot or AD today. Lacking necessary DF for normalized gait pattern- manual therapy gained 3-4 deg of DF which improved pattern. Denied anterior  impingement. Would like to work with trainers at work mostly and will follow up with [place] in 2 weeks. Planning on a golf trip in [place] in June.    Objective impairments include Abnormal gait, decreased activity tolerance, decreased mobility, difficulty walking, decreased ROM, decreased strength, increased edema, impaired flexibility, improper body mechanics, and pain. These impairments are limiting patient from cleaning, community activity, driving, meal prep, occupation, yard work, and shopping.  Personal factors including  Left hand surgery performed on same day  are also affecting patient's functional outcome. Patient will benefit from skilled PT to address above impairments and improve overall function.   REHAB POTENTIAL: Good  CLINICAL DECISION MAKING: Stable/uncomplicated  EVALUATION COMPLEXITY: Low   GOALS:   SHORT TERM GOALS:  STG Name Target Date Goal status  1 Pt will demo proper form in HEP Baseline:  05/28/2021 achieved  2 Pt will be independent with incision care and RICE techniques Baseline:  05/28/2021 achieved   LONG TERM GOALS:   LTG Name Target Date Goal status  1 Pt will demo ambulation pattern without compensation Baseline: NWB at eval 06/01/21 INITIAL  2 Pt will navigate steps and stairs without compensation Baseline: NWB at eval 06/01/21 INITIAL  3 Average pain level <=3/10 Baseline: up to severe levels when pain shoots at eval 06/01/21 INITIAL  4 Pt will return to preferred exercise program for physical fitness Baseline: unable at eval 06/01/21 INITIAL   PLAN: PT FREQUENCY: 1-2x/week  PT DURATION: 10 weeks  PLANNED INTERVENTIONS: Therapeutic exercises, Therapeutic activity, Neuro Muscular re-education, Balance training, Gait training, Patient/Family education, Joint mobilization, Stair training, Aquatic Therapy, Dry Needling, Electrical stimulation, Cryotherapy, Moist heat, scar mobilization, Taping, Vasopneumatic device, and Manual therapy  PLAN FOR NEXT  SESSION: check ROM, FOTO, stairs  Seri Kimmer C. Briget Shaheed PT, DPT 05/07/21 9:25 AM  PHYSICAL THERAPY DISCHARGE SUMMARY  Visits from Start of Care: 6  Current functional level related to goals / functional outcomes: See above   Remaining deficits: See above   Education / Equipment: Anatomy of condition, POC, HEP, exercise form/rationale    Patient agrees to discharge. Patient goals were partially met. Patient is being discharged due to being pleased with the current functional level. Shatarra Wehling C. Avilene Marrin PT, DPT 12/14/21 12:27 PM

## 2021-05-18 ENCOUNTER — Encounter (HOSPITAL_BASED_OUTPATIENT_CLINIC_OR_DEPARTMENT_OTHER): Payer: Self-pay | Admitting: Physical Therapy

## 2021-05-18 ENCOUNTER — Encounter: Payer: BC Managed Care – PPO | Admitting: Rehabilitative and Restorative Service Providers"

## 2021-06-04 ENCOUNTER — Encounter (HOSPITAL_BASED_OUTPATIENT_CLINIC_OR_DEPARTMENT_OTHER): Payer: Self-pay

## 2021-06-04 ENCOUNTER — Other Ambulatory Visit (HOSPITAL_BASED_OUTPATIENT_CLINIC_OR_DEPARTMENT_OTHER): Payer: Self-pay | Admitting: Orthopaedic Surgery

## 2021-06-04 ENCOUNTER — Encounter (HOSPITAL_BASED_OUTPATIENT_CLINIC_OR_DEPARTMENT_OTHER): Payer: BC Managed Care – PPO | Admitting: Orthopaedic Surgery

## 2021-06-04 DIAGNOSIS — S8261XA Displaced fracture of lateral malleolus of right fibula, initial encounter for closed fracture: Secondary | ICD-10-CM

## 2021-06-05 ENCOUNTER — Other Ambulatory Visit (HOSPITAL_BASED_OUTPATIENT_CLINIC_OR_DEPARTMENT_OTHER): Payer: Self-pay | Admitting: Orthopaedic Surgery

## 2021-06-05 DIAGNOSIS — S93432A Sprain of tibiofibular ligament of left ankle, initial encounter: Secondary | ICD-10-CM

## 2021-06-06 ENCOUNTER — Ambulatory Visit (HOSPITAL_BASED_OUTPATIENT_CLINIC_OR_DEPARTMENT_OTHER)
Admission: RE | Admit: 2021-06-06 | Discharge: 2021-06-06 | Disposition: A | Discharge status: Home / Self Care | Payer: BC Managed Care – PPO | Source: Ambulatory Visit | Attending: Orthopaedic Surgery | Admitting: Orthopaedic Surgery

## 2021-06-06 ENCOUNTER — Ambulatory Visit (INDEPENDENT_AMBULATORY_CARE_PROVIDER_SITE_OTHER): Payer: BC Managed Care – PPO | Admitting: Orthopaedic Surgery

## 2021-06-06 DIAGNOSIS — S93432A Sprain of tibiofibular ligament of left ankle, initial encounter: Secondary | ICD-10-CM | POA: Insufficient documentation

## 2021-06-06 NOTE — Progress Notes (Signed)
? ?                            ? ? ?Post Operative Evaluation ?  ? ?Procedure/Date of Surgery: left ankle open reduction internal fixation 03/14/21 ? ?Interval History:  ? ?Presents today 12 weeks follow-up after the above procedure.  He is doing very well.  Presents today for follow-up.  He has been working with Producer, television/film/videoathletic training staff at [employer] which is going very well.  Overall has no complaints.  He is walking longer distances without pain. ? ? ?PMH/PSH/Family History/Social History/Meds/Allergies:   ?No past medical history on file. ?Past Surgical History:  ?Procedure Laterality Date  ? HIP SURGERY    ? LUMBAR DISC SURGERY    ? OPEN REDUCTION INTERNAL FIXATION (ORIF) METACARPAL Left 03/14/2021  ? Procedure: OPEN REDUCTION INTERNAL FIXATION (ORIF) LEFT 5TH METACARPAL;  Surgeon: Marlyne BeardsBenfield, Charlie, MD;  [facility];  Service: Orthopedics;  Laterality: Left;  ? ORIF ANKLE FRACTURE Left 03/14/2021  ? Procedure: OPEN REDUCTION INTERNAL FIXATION (ORIF) ANKLE FRACTURE;  Surgeon: Huel CoteBokshan, Jaydence Arnesen, MD;  Location: [facility];  Service: Orthopedics;  Laterality: Left;  ? SKIN BIOPSY Bilateral 08/17/2020  ? pigmented seborrheic keratosis, irritated  ? ?Social History  ? ?Socioeconomic History  ? Marital status: Single  ?  Spouse name: Not on file  ? Number of children: Not on file  ? Years of education: Not on file  ? Highest education level: Not on file  ?Occupational History  ? Not on file  ?Tobacco Use  ? Smoking status: Never  ? Smokeless tobacco: Never  ?Vaping Use  ? Vaping Use: Never used  ?Substance and Sexual Activity  ? Alcohol use: Yes  ?  Alcohol/week: 10.0 standard drinks  ?  Types: 4 Glasses of wine, 6 Cans of beer per week  ? Drug use: Never  ? Sexual activity: Yes  ?  Partners: Female  ?  Birth control/protection: None  ?Other Topics Concern  ? Not on file  ?Social History Narrative  ? Single, 3 children.  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  ? ?Social Determinants of Health  ? ?Financial Resource Strain: Not on  file  ?Food Insecurity: Not on file  ?Transportation Needs: Not on file  ?Physical Activity: Not on file  ?Stress: Not on file  ?Social Connections: Not on file  ? ?Family History  ?Problem Relation Age of Onset  ? Congestive Heart Failure Father   ? CAD Father [id number]  ? ?No Known Allergies ?Current Outpatient Medications  ?Medication Sig Dispense Refill  ? oxycodone (OXY-IR) 5 MG capsule Take 1 capsule (5 mg total) by mouth every 4 (four) hours as needed (severe pain). 10 capsule 0  ? aspirin EC 325 MG tablet Take 1 tablet (325 mg total) by mouth daily. 30 tablet 0  ? Cholecalciferol (VITAMIN D3 PO) Take 1 tablet by mouth 4 (four) times a week.    ? Cyanocobalamin (VITAMIN B-12 PO) Take 1 tablet by mouth 4 (four) times a week.    ? Multiple Vitamin (MULTIVITAMIN WITH MINERALS) TABS tablet Take 1 tablet by mouth 4 (four) times a week.    ? Multiple Vitamins-Minerals (AIRBORNE PO) Take 1 tablet by mouth daily as needed (with travels/flying).    ? Multiple Vitamins-Minerals (PRESERVISION AREDS 2 PO) Take 1 tablet by mouth 4 (four) times a week.    ? Multiple Vitamins-Minerals (ZINC PO) Take 1 tablet by mouth 4 (four) times  a week.    ? oxycodone (OXY-IR) 5 MG capsule Take 1 capsule (5 mg total) by mouth every 4 (four) hours as needed (severe pain). 20 capsule 0  ? ?No current facility-administered medications for this visit.  ? ?No results found. ? ?Review of Systems:   ?A ROS was performed including pertinent positives and negatives as documented in the HPI. ? ? ?Musculoskeletal Exam:   ? ?There were no vitals taken for this visit. ? ?Left ankle incisions are well healed.  Much improved swelling. SILT all distributions. 2+ DP. ? ?Imaging:   ? ?X-rays left ankle 3 views: ?Status post open ankle reduction internal fixation with increased fibular healing.  Congruent syndesmosis and mortise ? ?I personally reviewed and interpreted the radiographs. ? ? ?Assessment:   ?61 year old male 12 status post ankle reduction and  internal fixation.  Overall doing extremely well.  At this time I have advised that he may begin to chip and putt and slowly progress his way through golfing and longer walks.  I will see him back in 2 months in training room ?Plan :   ? ?- Return to training room in 2 months ? ? ? ?I personally saw and evaluated the patient, and participated in the management and treatment plan. ? ?Vanetta Mulders, MD ?Attending Physician, Orthopedic Surgery ? ?This document was dictated using Systems analyst. A reasonable attempt at proof reading has been made to minimize errors. ?

## 2021-07-06 ENCOUNTER — Encounter (HOSPITAL_BASED_OUTPATIENT_CLINIC_OR_DEPARTMENT_OTHER): Payer: Self-pay

## 2021-07-06 ENCOUNTER — Encounter (HOSPITAL_BASED_OUTPATIENT_CLINIC_OR_DEPARTMENT_OTHER): Payer: BC Managed Care – PPO | Admitting: Physical Therapy

## 2021-07-25 ENCOUNTER — Other Ambulatory Visit: Payer: Self-pay | Admitting: Family Medicine

## 2021-07-25 MED ORDER — MELOXICAM 15 MG PO TABS: 15.0000 mg | ORAL_TABLET | Freq: Every day | ORAL | 0 refills | Status: DC

## 2021-07-25 MED ORDER — ZOLPIDEM TARTRATE ER 12.5 MG PO TBCR: 12.5000 mg | EXTENDED_RELEASE_TABLET | Freq: Every evening | ORAL | 0 refills | Status: DC | PRN

## 2021-07-25 NOTE — Progress Notes (Signed)
He is getting ready for an overseas trip.  He would like a sleep med and I will call in Ambien.  Gave him information on being careful with alcohol consumption.  We will also give him meloxicam to help with general pain and to avoid taking Advil or Aleve with that.

## 2021-10-16 ENCOUNTER — Ambulatory Visit: Payer: BC Managed Care – PPO | Admitting: Orthopedic Surgery

## 2021-10-19 ENCOUNTER — Ambulatory Visit (INDEPENDENT_AMBULATORY_CARE_PROVIDER_SITE_OTHER): Payer: BC Managed Care – PPO

## 2021-10-19 ENCOUNTER — Ambulatory Visit (INDEPENDENT_AMBULATORY_CARE_PROVIDER_SITE_OTHER): Payer: BC Managed Care – PPO | Admitting: Orthopedic Surgery

## 2021-10-19 ENCOUNTER — Encounter: Payer: Self-pay | Admitting: Orthopedic Surgery

## 2021-10-19 ENCOUNTER — Ambulatory Visit (INDEPENDENT_AMBULATORY_CARE_PROVIDER_SITE_OTHER): Payer: BC Managed Care – PPO | Admitting: Orthopaedic Surgery

## 2021-10-19 DIAGNOSIS — S8261XA Displaced fracture of lateral malleolus of right fibula, initial encounter for closed fracture: Secondary | ICD-10-CM

## 2021-10-19 DIAGNOSIS — M79642 Pain in left hand: Secondary | ICD-10-CM

## 2021-10-19 DIAGNOSIS — S62337A Displaced fracture of neck of fifth metacarpal bone, left hand, initial encounter for closed fracture: Secondary | ICD-10-CM | POA: Diagnosis not present

## 2021-10-19 DIAGNOSIS — M20039 Swan-neck deformity of unspecified finger(s): Secondary | ICD-10-CM | POA: Insufficient documentation

## 2021-10-19 DIAGNOSIS — M20032 Swan-neck deformity of left finger(s): Secondary | ICD-10-CM

## 2021-10-19 NOTE — Progress Notes (Signed)
Office Visit Note   Patient: Nicholas Bright           Date of Birth: 1960/10/23           MRN: [id number] Visit Date: 10/19/2021              Requested by: Ronnald Nian, MD [address] [place],  [place] [address] PCP: Ronnald Nian, MD   Assessment & Plan: Visit Diagnoses:  1. Pain in left hand   2. Closed displaced fracture of neck of fifth metacarpal bone of left hand, initial encounter   3. 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  He has no pain in the fifth finger and x-rays show a healed fracture.  He does have some stiffness in the fourth and fifth digit when he wakes up first thing the morning.  He has an old injury to the left ring finger with associated swan-neck deformity.  We discussed the nature of swan-neck deformities as well as treatment with both a brace and with a surgical reconstruction.  Patient is not interested in surgery.  He had does have a brace that he can wear at night that may help.  He can follow-up again as needed.  Follow-Up Instructions: No follow-ups on file.   Orders:  Orders Placed This Encounter  Procedures   XR Hand Complete Left   No orders of the defined types were placed in this encounter.     Procedures: No procedures performed   Clinical Data: No additional findings.   Subjective: Chief Complaint  Patient presents with   Left Hand - Fracture, Follow-up    This is a 61 year old right-hand-dominant male who is about 8 months out from a left small finger metacarpal neck fracture.  This was treated with an intramedullary screw.  He is doing very well postoperatively.  His fracture is completely healed.  He has no pain in the small finger.  Is full range of motion of small finger.  He does describe some stiffness in the ring and small finger on this hand when he wakes first thing in the morning.  He  has an old injury to the left ring finger with an associated swan-neck deformity.  He has a brace that he is worn for this ring finger deformity.  He has no pain with either finger.    Review of Systems   Objective: Vital Signs: There were no vitals taken for this visit.  Physical Exam  Right Hand Exam   Tenderness  The patient is experiencing no tenderness.   Other  Erythema: absent Sensation: normal Pulse: present  Comments:  Full ROM of small finger.  Some limited flexion of ring finger secondary to chronic injury.  Swan neck deformity of ring finger w/ extension.      Specialty Comments:  No specialty comments available.  Imaging: No results found.   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 No pertinent  past medical history.  Family History  Problem Relation Age of Onset   Congestive Heart Failure Father    CAD Father 52    Past Surgical History:  Procedure Laterality Date   HIP SURGERY     LUMBAR DISC SURGERY     OPEN REDUCTION INTERNAL FIXATION (ORIF) METACARPAL Left 03/14/2021   Procedure: OPEN REDUCTION INTERNAL FIXATION (ORIF) LEFT 5TH METACARPAL;  Surgeon: Marlyne Beards, MD;  [facility];  Service: Orthopedics;  Laterality: Left;   ORIF ANKLE FRACTURE Left 03/14/2021   Procedure: OPEN REDUCTION INTERNAL FIXATION (ORIF) ANKLE FRACTURE;  Surgeon: Huel Cote, MD;  [facility];  Service: Orthopedics;  Laterality: Left;   SKIN BIOPSY Bilateral 08/17/2020   pigmented seborrheic keratosis, irritated   Social  History   Occupational History   Not on file  Tobacco Use   Smoking status: Never   Smokeless tobacco: Never  Vaping Use   Vaping Use: Never used  Substance and Sexual Activity   Alcohol use: Yes    Alcohol/week: 10.0 standard drinks of alcohol    Types: 4 Glasses of wine, 6 Cans of beer per week   Drug use: Never   Sexual activity: Yes    Partners: Female    Birth control/protection: None

## 2021-10-19 NOTE — Progress Notes (Signed)
Post Operative Evaluation    Procedure/Date of Surgery: left ankle open reduction internal fixation 03/14/21  Interval History:   Presents today for follow-up 7 months status post ankle fixation.  Overall he is doing very well.  Does complain of tightness and some irritation from the hardware although this is overall mild.   PMH/PSH/Family History/Social History/Meds/Allergies:   No past medical history on file. Past Surgical History:  Procedure Laterality Date   HIP SURGERY     LUMBAR DISC SURGERY     OPEN REDUCTION INTERNAL FIXATION (ORIF) METACARPAL Left 03/14/2021   Procedure: OPEN REDUCTION INTERNAL FIXATION (ORIF) LEFT 5TH METACARPAL;  Surgeon: Marlyne Beards, MD;  [facility];  Service: Orthopedics;  Laterality: Left;   ORIF ANKLE FRACTURE Left 03/14/2021   Procedure: OPEN REDUCTION INTERNAL FIXATION (ORIF) ANKLE FRACTURE;  Surgeon: Huel Cote, MD;  Location[facility];  Service: Orthopedics;  Laterality: Left;   SKIN BIOPSY Bilateral 08/17/2020   pigmented seborrheic keratosis, irritated   Social History   Socioeconomic History   Marital status: Single    Spouse name: Not on file   Number of children: Not on file   Years of education: Not on file   Highest education level: Not on file  Occupational History   Not on file  Tobacco Use   Smoking status: Never   Smokeless tobacco: Never  Vaping Use   Vaping Use: Never used  Substance and Sexual Activity   Alcohol use: Yes    Alcohol/week: 10.0 standard drinks of alcohol    Types: 4 Glasses of wine, 6 Cans of beer per week   Drug use: Never   Sexual activity: Yes    Partners: Female    Birth control/protection: None  Other Topics Concern   Not on file  Social History Narrative   Single, 3 children.  He is the Runner, broadcasting/film/video for [employer].   Social Determinants of Health   Financial Resource Strain: Not on file  Food Insecurity: Not on file  Transportation Needs: Not on  file  Physical Activity: Not on file  Stress: Not on file  Social Connections: Not on file   Family History  Problem Relation Age of Onset   Congestive Heart Failure Father    CAD Father 25   No Known Allergies Current Outpatient Medications  Medication Sig Dispense Refill   oxycodone (OXY-IR) 5 MG capsule Take 1 capsule (5 mg total) by mouth every 4 (four) hours as needed (severe pain). 10 capsule 0   aspirin EC 325 MG tablet Take 1 tablet (325 mg total) by mouth daily. 30 tablet 0   Cholecalciferol (VITAMIN D3 PO) Take 1 tablet by mouth 4 (four) times a week.     Cyanocobalamin (VITAMIN B-12 PO) Take 1 tablet by mouth 4 (four) times a week.     meloxicam (MOBIC) 15 MG tablet Take 1 tablet (15 mg total) by mouth daily. 20 tablet 0   Multiple Vitamin (MULTIVITAMIN WITH MINERALS) TABS tablet Take 1 tablet by mouth 4 (four) times a week.     Multiple Vitamins-Minerals (AIRBORNE PO) Take 1 tablet by mouth daily as needed (with travels/flying).     Multiple Vitamins-Minerals (PRESERVISION AREDS 2 PO) Take 1 tablet by mouth 4 (four) times a week.     Multiple Vitamins-Minerals (ZINC PO) Take 1 tablet  by mouth 4 (four) times a week.     oxycodone (OXY-IR) 5 MG capsule Take 1 capsule (5 mg total) by mouth every 4 (four) hours as needed (severe pain). 20 capsule 0   zolpidem (AMBIEN CR) 12.5 MG CR tablet Take 1 tablet (12.5 mg total) by mouth at bedtime as needed for sleep. 30 tablet 0   No current facility-administered medications for this visit.   XR Hand Complete Left  Result Date: 10/19/2021 Multiple views of the left hand taken today reviewed interpreted by me.  Demonstrate a healed fifth metacarpal neck fracture with an intramedullary screw.  There is a swan-neck posturing of the ring finger on the x-ray.  There is no evidence of significant degenerative changes present in any joint.   Review of Systems:   A ROS was performed including pertinent positives and negatives as documented in  the HPI.   Musculoskeletal Exam:    There were no vitals taken for this visit.  Left ankle incisions are well healed.  Much improved swelling. SILT all distributions. 2+ DP.  Imaging:    X-rays left ankle 3 views: Status post open ankle reduction internal fixation with healed fracture  I personally reviewed and interpreted the radiographs.   Assessment:   60 year old male 7 months status post open reduction internal fixation of his left ankle overall doing very well.  At this time he will return to clinic as needed Plan :    - Return to clinic as needed    I personally saw and evaluated the patient, and participated in the management and treatment plan.  Huel Cote, MD Attending Physician, Orthopedic Surgery  This document was dictated using Dragon voice recognition software. A reasonable attempt at proof reading has been made to minimize errors.

## 2021-10-31 ENCOUNTER — Encounter: Payer: Self-pay | Admitting: Internal Medicine

## 2021-11-07 ENCOUNTER — Ambulatory Visit: Payer: BC Managed Care – PPO | Admitting: Podiatry

## 2021-12-04 ENCOUNTER — Encounter: Payer: Self-pay | Admitting: Internal Medicine

## 2021-12-11 ENCOUNTER — Ambulatory Visit (INDEPENDENT_AMBULATORY_CARE_PROVIDER_SITE_OTHER): Payer: BC Managed Care – PPO | Admitting: Podiatry

## 2021-12-11 ENCOUNTER — Ambulatory Visit (INDEPENDENT_AMBULATORY_CARE_PROVIDER_SITE_OTHER): Payer: BC Managed Care – PPO

## 2021-12-11 DIAGNOSIS — M79672 Pain in left foot: Secondary | ICD-10-CM

## 2021-12-11 DIAGNOSIS — M722 Plantar fascial fibromatosis: Secondary | ICD-10-CM

## 2021-12-11 DIAGNOSIS — M79671 Pain in right foot: Secondary | ICD-10-CM | POA: Diagnosis not present

## 2021-12-11 DIAGNOSIS — G629 Polyneuropathy, unspecified: Secondary | ICD-10-CM | POA: Diagnosis not present

## 2021-12-11 NOTE — Patient Instructions (Signed)

## 2021-12-17 ENCOUNTER — Encounter: Payer: Self-pay | Admitting: Internal Medicine

## 2021-12-18 NOTE — Progress Notes (Signed)
Subjective:   Patient ID: Nicholas Bright, male   DOB: 61 y.o.   MRN: [id number]   HPI Chief Complaint  Patient presents with   Peripheral Neuropathy    Plantar fasciitis bilateral, heel pain, Numbness on the bottom of the feet, patient has been to good feet for insoles, Started 3 years ago, Patient also had back surg in 2004, right hip surg in 2018, [place]: Inserts    61 year old male presents the office today with above concerns.  Numbness to thetoes.  History about 3 years ago.  He states that he been having some back issues and has a herniated disc in his low back as well.  Also states he has a family history of neuropathy.  He has also had right hip surgery as well as left ankle ORIF.  He states that also since he was wearing a certain shoe, [employer], arch discomfort.  No recent injuries to his feet.  No recent treatment other than trying over-the-counter inserts.   Review of Systems  All other systems reviewed and are negative.  No past medical history on file.  Past Surgical History:  Procedure Laterality Date   HIP SURGERY     LUMBAR DISC SURGERY     OPEN REDUCTION INTERNAL FIXATION (ORIF) METACARPAL Left 03/14/2021   Procedure: OPEN REDUCTION INTERNAL FIXATION (ORIF) LEFT 5TH METACARPAL;  Surgeon: Sherilyn Cooter, MD;  Location: [place];  Service: Orthopedics;  Laterality: Left;   ORIF ANKLE FRACTURE Left 03/14/2021   Procedure: OPEN REDUCTION INTERNAL FIXATION (ORIF) ANKLE FRACTURE;  Surgeon: Vanetta Mulders, MD;  Location: [place];  Service: Orthopedics;  Laterality: Left;   SKIN BIOPSY Bilateral 08/17/2020   pigmented seborrheic keratosis, irritated     Current Outpatient Medications:    oxycodone (OXY-IR) 5 MG capsule, Take 1 capsule (5 mg total) by mouth every 4 (four) hours as needed (severe pain)., Disp: 10 capsule, Rfl: 0   aspirin EC 325 MG tablet, Take 1 tablet (325 mg total) by mouth daily., Disp: 30 tablet, Rfl: 0   Cholecalciferol (VITAMIN D3 PO), Take 1 tablet by mouth 4  (four) times a week., Disp: , Rfl:    Cyanocobalamin (VITAMIN B-12 PO), Take 1 tablet by mouth 4 (four) times a week., Disp: , Rfl:    meloxicam (MOBIC) 15 MG tablet, Take 1 tablet (15 mg total) by mouth daily., Disp: 20 tablet, Rfl: 0   Multiple Vitamin (MULTIVITAMIN WITH MINERALS) TABS tablet, Take 1 tablet by mouth 4 (four) times a week., Disp: , Rfl:    Multiple Vitamins-Minerals (AIRBORNE PO), Take 1 tablet by mouth daily as needed (with travels/flying)., Disp: , Rfl:    Multiple Vitamins-Minerals (PRESERVISION AREDS 2 PO), Take 1 tablet by mouth 4 (four) times a week., Disp: , Rfl:    Multiple Vitamins-Minerals (ZINC PO), Take 1 tablet by mouth 4 (four) times a week., Disp: , Rfl:    oxycodone (OXY-IR) 5 MG capsule, Take 1 capsule (5 mg total) by mouth every 4 (four) hours as needed (severe pain)., Disp: 20 capsule, Rfl: 0   zolpidem (AMBIEN CR) 12.5 MG CR tablet, Take 1 tablet (12.5 mg total) by mouth at bedtime as needed for sleep. (Patient not taking: Reported on 12/11/2021), Disp: 30 tablet, Rfl: 0  No Known Allergies         Objective:  Physical Exam  General: AAO x3, NAD  Dermatological: Skin is warm, dry and supple bilateral. There are no open sores, no preulcerative lesions, no rash or signs of infection  present.  Vascular: Dorsalis Pedis artery and Posterior Tibial artery pedal pulses are 2/4 bilateral with immedate capillary fill time. There is no pain with calf compression, swelling, warmth, erythema.   Neruologic: Grossly intact via light touch bilateral.  Negative Tinel sign.  Sensation appears to be intact with Thornell Mule monofilament but there is decreased vibratory sensation bilaterally.  Musculoskeletal: Cavus foot type is present.  There is tenderness palpation on the medial band of plantar fascia in the arch of the foot.  No area pinpoint tenderness noted.  No edema, erythema.  Gait: Unassisted, Nonantalgic.       Assessment:   61 year old male with  plantar fascia/arch pain; concern for     Plan:  -Treatment options discussed including all alternatives, risks, and complications -Etiology of symptoms were discussed -X-rays were obtained and reviewed with the patient.  3 views bilateral feet were obtained.  No evidence of acute fracture. -The numbness to his toes I do think he could be having early neuropathy.  He has been having some back issues as well and is in the follow-up with orthopedics for this.  If needed I can place referral as well.  If no improvement consider neurology evaluation -Regards to his foot we discussed stretching, icing a regular basis.  Continue inserts and supportive shoes.  Consider making custom orthotics as well.    Trula Slade DPM

## 2021-12-25 ENCOUNTER — Other Ambulatory Visit: Payer: Self-pay | Admitting: Podiatry

## 2021-12-25 DIAGNOSIS — M722 Plantar fascial fibromatosis: Secondary | ICD-10-CM

## 2022-01-02 ENCOUNTER — Ambulatory Visit (INDEPENDENT_AMBULATORY_CARE_PROVIDER_SITE_OTHER): Payer: BC Managed Care – PPO | Admitting: Orthopaedic Surgery

## 2022-01-02 ENCOUNTER — Ambulatory Visit (INDEPENDENT_AMBULATORY_CARE_PROVIDER_SITE_OTHER): Payer: BC Managed Care – PPO

## 2022-01-02 DIAGNOSIS — G5793 Unspecified mononeuropathy of bilateral lower limbs: Secondary | ICD-10-CM

## 2022-01-02 DIAGNOSIS — M25552 Pain in left hip: Secondary | ICD-10-CM

## 2022-01-02 NOTE — Progress Notes (Signed)
Chief Complaint: Bilateral foot neuropathy, left groin pain     History of Present Illness:    Nicholas Bright is a 61 y.o. male presents today with several years of left groin pain.  He does have a history of contralateral right total hip arthroplasty.  States that he feels a pulling in the left hip with certain stretches.  He does still stay active and is on the treadmill and elliptical often.  With regard to the bottom of his feet these have felt numb now for the last several years.  His father does have a history of neuropathy.  He does have a history of a discectomy in his back several years prior which was done in [place] although at the time there was radiating pain.  He states that the pain on the bottom of his feet does not radiate.  Denies any history of diabetes.    Surgical History:   None  PMH/PSH/Family History/Social History/Meds/Allergies:   No past medical history on file. Past Surgical History:  Procedure Laterality Date   HIP SURGERY     LUMBAR DISC SURGERY     OPEN REDUCTION INTERNAL FIXATION (ORIF) METACARPAL Left 03/14/2021   Procedure: OPEN REDUCTION INTERNAL FIXATION (ORIF) LEFT 5TH METACARPAL;  Surgeon: Marlyne Beards, MD;  [facility];  Service: Orthopedics;  Laterality: Left;   ORIF ANKLE FRACTURE Left 03/14/2021   Procedure: OPEN REDUCTION INTERNAL FIXATION (ORIF) ANKLE FRACTURE;  Surgeon: Huel Cote, MD;  Location[facility];  Service: Orthopedics;  Laterality: Left;   SKIN BIOPSY Bilateral 08/17/2020   pigmented seborrheic keratosis, irritated   Social History   Socioeconomic History   Marital status: Single    Spouse name: Not on file   Number of children: Not on file   Years of education: Not on file   Highest education level: Not on file  Occupational History   Not on file  Tobacco Use   Smoking status: Never   Smokeless tobacco: Never  Vaping Use   Vaping Use: Never used  Substance and Sexual Activity    Alcohol use: Yes    Alcohol/week: 10.0 standard drinks of alcohol    Types: 4 Glasses of wine, 6 Cans of beer per week   Drug use: Never   Sexual activity: Yes    Partners: Female    Birth control/protection: None  Other Topics Concern   Not on file  Social History Narrative   Single, 3 children.  He is the Runner, broadcasting/film/video for [employer].   Social Determinants of Health   Financial Resource Strain: Not on file  Food Insecurity: Not on file  Transportation Needs: Not on file  Physical Activity: Not on file  Stress: Not on file  Social Connections: Not on file   Family History  Problem Relation Age of Onset   Congestive Heart Failure Father    CAD Father 54   No Known Allergies Current Outpatient Medications  Medication Sig Dispense Refill   oxycodone (OXY-IR) 5 MG capsule Take 1 capsule (5 mg total) by mouth every 4 (four) hours as needed (severe pain). 10 capsule 0   aspirin EC 325 MG tablet Take 1 tablet (325 mg total) by mouth daily. 30 tablet 0   Cholecalciferol (VITAMIN D3 PO) Take 1 tablet by mouth 4 (four) times a week.  Cyanocobalamin (VITAMIN B-12 PO) Take 1 tablet by mouth 4 (four) times a week.     meloxicam (MOBIC) 15 MG tablet Take 1 tablet (15 mg total) by mouth daily. 20 tablet 0   Multiple Vitamin (MULTIVITAMIN WITH MINERALS) TABS tablet Take 1 tablet by mouth 4 (four) times a week.     Multiple Vitamins-Minerals (AIRBORNE PO) Take 1 tablet by mouth daily as needed (with travels/flying).     Multiple Vitamins-Minerals (PRESERVISION AREDS 2 PO) Take 1 tablet by mouth 4 (four) times a week.     Multiple Vitamins-Minerals (ZINC PO) Take 1 tablet by mouth 4 (four) times a week.     oxycodone (OXY-IR) 5 MG capsule Take 1 capsule (5 mg total) by mouth every 4 (four) hours as needed (severe pain). 20 capsule 0   zolpidem (AMBIEN CR) 12.5 MG CR tablet Take 1 tablet (12.5 mg total) by mouth at bedtime as needed for sleep. (Patient not taking: Reported on 12/11/2021) 30  tablet 0   No current facility-administered medications for this visit.   No results found.  Review of Systems:   A ROS was performed including pertinent positives and negatives as documented in the HPI.  Physical Exam :   Constitutional: NAD and appears stated age Neurological: Alert and oriented Psych: Appropriate affect and cooperative There were no vitals taken for this visit.   Comprehensive Musculoskeletal Exam:    There is sensation about the plantar aspect of both feet although monofilament test was not performed today.  There is subjective diminished sensation.  No radiating pain.  Negative straight leg raise.  Limited range of motion about the left hip with approximately 20 degrees of internal rotation with pain.  45 of external rotation.  Imaging:   Xray (4 views left hip): Moderate to severe osteoarthritis of the left hip    I personally reviewed and interpreted the radiographs.   Assessment:   61 y.o. 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  I did discuss treatment options including possible arthroplasty.  That being said I do believe he would be candidate for an injection as well.  He would like to defer this today as his symptoms are not as severe.  With regard to the bilateral numbness in his feet, I do believe an EMG and nerve conduction test with Dr. Ernestina Patches would be helpful in ascertaining if this is a neuropathy source versus resulting from his lower back a recurrence of his disc herniation.  I did describe that given the lack of radiation down the back to me this seems more consistent with neuropathy  Plan :    -Plan for referral to Dr. Laurence Spates for assessment of neuropathy versus lumbar etiology of his plantar numbness     I personally saw and evaluated the patient, and participated in the management and treatment plan.  Vanetta Mulders, MD Attending Physician, Orthopedic Surgery  This document was dictated using Dragon voice  recognition software. A reasonable attempt at proof reading has been made to minimize errors.

## 2022-01-09 ENCOUNTER — Other Ambulatory Visit (HOSPITAL_BASED_OUTPATIENT_CLINIC_OR_DEPARTMENT_OTHER): Payer: Self-pay | Admitting: Orthopaedic Surgery

## 2022-01-09 DIAGNOSIS — G5793 Unspecified mononeuropathy of bilateral lower limbs: Secondary | ICD-10-CM

## 2022-01-10 ENCOUNTER — Other Ambulatory Visit (HOSPITAL_BASED_OUTPATIENT_CLINIC_OR_DEPARTMENT_OTHER): Payer: Self-pay | Admitting: Orthopaedic Surgery

## 2022-01-10 DIAGNOSIS — G5793 Unspecified mononeuropathy of bilateral lower limbs: Secondary | ICD-10-CM

## 2022-01-21 ENCOUNTER — Other Ambulatory Visit: Payer: Self-pay

## 2022-01-21 ENCOUNTER — Encounter: Payer: Self-pay | Admitting: Neurology

## 2022-01-21 DIAGNOSIS — R202 Paresthesia of skin: Secondary | ICD-10-CM

## 2022-01-23 ENCOUNTER — Encounter: Payer: Self-pay | Admitting: Sports Medicine

## 2022-01-23 ENCOUNTER — Ambulatory Visit (INDEPENDENT_AMBULATORY_CARE_PROVIDER_SITE_OTHER): Payer: BC Managed Care – PPO | Admitting: Sports Medicine

## 2022-01-23 VITALS — BP 153/95 | HR 101 | Ht 76.0 in | Wt 212.4 lb

## 2022-01-23 DIAGNOSIS — R030 Elevated blood-pressure reading, without diagnosis of hypertension: Secondary | ICD-10-CM | POA: Diagnosis not present

## 2022-01-23 DIAGNOSIS — J0100 Acute maxillary sinusitis, unspecified: Secondary | ICD-10-CM

## 2022-01-23 DIAGNOSIS — Z8249 Family history of ischemic heart disease and other diseases of the circulatory system: Secondary | ICD-10-CM | POA: Diagnosis not present

## 2022-01-23 DIAGNOSIS — Z0000 Encounter for general adult medical examination without abnormal findings: Secondary | ICD-10-CM | POA: Diagnosis not present

## 2022-01-23 MED ORDER — AZITHROMYCIN 250 MG PO TABS: ORAL_TABLET | ORAL | 0 refills | Status: DC

## 2022-01-23 NOTE — Progress Notes (Deleted)
   Nicholas Bright - 61 y.o. male MRN [id number]  Date of birth: 01/27/1961  Office Visit Note: Visit Date: 01/23/2022 PCP: Ronnald Nian, MD Referred by: Ronnald Nian, MD  Subjective: No chief complaint on file.  HPI: Nicholas Bright is a pleasant 61 y.o. male who presents today for annual physical.  Body mass index is 25.85 kg/m.  Pertinent ROS were reviewed with the patient and found to be negative unless otherwise specified above in HPI.   Assessment & Plan: Visit Diagnoses: No diagnosis found.  Plan: ***  Follow-up: No follow-ups on file.   Meds & Orders: No orders of the defined types were placed in this encounter.  No orders of the defined types were placed in this encounter.    Procedures: No procedures performed      Clinical History: No specialty comments available.  He reports that he has never smoked. He has never used smokeless tobacco. No results for input(s): "HGBA1C", "LABURIC" in the last 8760 hours.  Objective:   Vital Signs: BP (!) 153/95   Pulse (!) 101   Ht 6\' 4"  (1.93 m)   Wt 212 lb 6.4 oz (96.3 kg)   BMI 25.85 kg/m   Physical Exam  Gen: Well-appearing, in no acute distress; non-toxic CV: Regular Rate. Well-perfused. Warm.  Resp: Breathing unlabored on room air; no wheezing. Psych: Fluid speech in conversation; appropriate affect; normal thought process Neuro: Sensation intact throughout. No gross coordination deficits.   Ortho Exam - ***  Imaging: No results found.  Past Medical/Family/Surgical/Social History: Medications & Allergies reviewed per EMR, new medications updated. 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 Family History  Problem Relation Age of Onset   Congestive Heart Failure Father    CAD Father 64   Past Surgical History:  Procedure Laterality Date   HIP SURGERY     LUMBAR DISC SURGERY     OPEN REDUCTION INTERNAL FIXATION (ORIF) METACARPAL Left 03/14/2021   Procedure: OPEN REDUCTION INTERNAL FIXATION (ORIF) LEFT 5TH METACARPAL;  Surgeon: 03/16/2021, MD;  [facility];  Service: Orthopedics;  Laterality: Left;   ORIF ANKLE FRACTURE Left 03/14/2021   Procedure: OPEN REDUCTION INTERNAL FIXATION (ORIF) ANKLE FRACTURE;  Surgeon: 03/16/2021, MD;  [facility];  Service: Orthopedics;  Laterality: Left;   SKIN BIOPSY Bilateral 08/17/2020   pigmented seborrheic keratosis, irritated   Social History   Occupational History   Not on file  Tobacco Use   Smoking status: Never   Smokeless tobacco: Never  Vaping Use   Vaping Use: Never used  Substance and Sexual Activity   Alcohol use: Yes    Alcohol/week: 10.0 standard drinks of alcohol    Types: 4 Glasses of wine, 6 Cans of beer per week   Drug use: Never   Sexual activity: Yes    Partners: Female    Birth control/protection: None

## 2022-01-23 NOTE — Progress Notes (Signed)
Annual physical check  Also wants sinus' checked; history of sinus infections   Chest congestion; green mucus when blowing nose

## 2022-01-23 NOTE — Progress Notes (Signed)
Office Visit Note   Patient: Nicholas Bright           Date of Birth: 1960/10/18           MRN: [id number] Visit Date: 01/23/2022 Requested by: Ronnald Nian, MD [address] [place],  [place] [address] PCP: Ronnald Nian, MD  Subjective: CC: Annual physical  HPI: [name] is a pleasant 61 year old male who presents today for annual physical.  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  Previously saw Dr. Susann Givens.  He is a healthy male.  He does not take any medications on a consistent basis.  Denies any personal history of CAD, diabetes, CKD or congestive heart failure.  His father does have a history of CAD and CHF.  Luvern is plugged in with cardiology, Dr. 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  No intervention was needed.  He has been doing well, does feel like he has a sinus infection.  He usually gets these about once a year during this time.  Has noticed a change in quality and color of this nasal congestion and sputum.  *Coronary calcium scoring on 11/16/20 of 13.6, this was a 37th percentile for age.              ROS:  -No chest pain, no dyspnea on exertion -No shortness of breath, wheezing + Nasal congestion + Recently decreased energy, otherwise normal energy levels  MEDS: currently takes none  Health Maintenance: Colonoscopy: Had a normal colonoscopy at age 89; recently just this past Monday sent off his Cologuard testing (awaiting this) Immunizations: Has had 3 COVID vaccinations including booster.  Is up-to-date on his flu shot this year.  Has not had shingles vaccine up-to-date -> would like to proceed with this in 2024.  Objective: Vital Signs: BP (!) 153/95   Pulse (!) 101   Ht 6\' 4"  (1.93 m)   Wt 212 lb 6.4 oz (96.3 kg)   BMI 25.85 kg/m   Physical Exam:  Gen: Well-appearing, in no acute distress; non-toxic HENT: Normocephalic, atraumatic; nasal congestion noted bilaterally with irritated turbinates; fullness to maxillary sinuses bilaterally;  posterior cobblestoning of the oropharynx CV: Regular rate, normal S1 and S2.  No murmurs gallops or rubs. Well-perfused. Warm.  Resp: Breathing unlabored on room air, full breath sounds in both anterior and posterior lung fields; no wheezing, rhonchi or rales Psych: Fluid speech in conversation; appropriate affect; normal thought process Neuro: Sensation intact throughout. No gross coordination deficits.   Imaging: N/a  Assessment & Plan: Annual physical exam - well male (see labs as above) Family History of CAD, CHF - reassuring coronary calcium scoring. Follows with cardiology. We are checking lipid panel today. Need for shingles vaccination - he would like to hold for now, will print script and plan for vaccination in 2024 per patient preference Acute maxillary sinusitis - IM depo today and Z-pack sent for patient. Discussed home supportive treatments Elevated BP without history of HTN - could be component of white coat HTN vs. Elevation from acute illness with mild dehydration. Will have him check BP in training room at [facility] after acute illness, if elevated may bring back in to re-check. Also, has follow-up with Cards in Jan 2024. Will notify me if any symptoms, currently asymptomatic.  Follow-Up Instructions: Return in about 1 year (around 01/24/2023).    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 No pertinent past medical history.  Family History  Problem Relation Age of Onset   Congestive Heart Failure Father    CAD Father 2     Past Surgical History:  Procedure Laterality Date   HIP SURGERY     LUMBAR DISC SURGERY     OPEN REDUCTION INTERNAL FIXATION (ORIF) METACARPAL Left 03/14/2021   Procedure: OPEN REDUCTION INTERNAL FIXATION (ORIF) LEFT 5TH METACARPAL;  Surgeon: Marlyne Beards, MD;  [facility];  Service: Orthopedics;  Laterality: Left;   ORIF ANKLE FRACTURE Left 03/14/2021   Procedure: OPEN REDUCTION INTERNAL FIXATION (ORIF) ANKLE FRACTURE;  Surgeon: Huel Cote, MD;  [facility];  Service: Orthopedics;  Laterality: Left;   SKIN BIOPSY Bilateral 08/17/2020   pigmented seborrheic keratosis, irritated   Social History   Occupational History   Not on file  Tobacco Use   Smoking status: Never   Smokeless tobacco: Never  Vaping Use   Vaping Use: Never used  Substance and Sexual Activity   Alcohol use: Yes    Alcohol/week: 10.0 standard drinks of alcohol    Types: 4 Glasses of wine, 6 Cans of beer per week   Drug use: Never   Sexual activity: Yes    Partners: Female    Birth control/protection: None

## 2022-01-24 LAB — COMPREHENSIVE METABOLIC PANEL
AG Ratio: 1.5 (calc) (ref 1.0–2.5)
ALT: 29 U/L (ref 9–46)
AST: 35 U/L (ref 10–35)
Albumin: 4.4 g/dL (ref 3.6–5.1)
Alkaline phosphatase (APISO): 63 U/L (ref 35–144)
BUN: 11 mg/dL (ref 7–25)
CO2: 27 mmol/L (ref 20–32)
Calcium: 9.7 mg/dL (ref 8.6–10.3)
Chloride: 103 mmol/L (ref 98–110)
Creat: 0.88 mg/dL (ref 0.70–1.35)
Globulin: 3 g/dL (calc) (ref 1.9–3.7)
Glucose, Bld: 89 mg/dL (ref 65–99)
Potassium: 4.4 mmol/L (ref 3.5–5.3)
Sodium: 139 mmol/L (ref 135–146)
Total Bilirubin: 0.9 mg/dL (ref 0.2–1.2)
Total Protein: 7.4 g/dL (ref 6.1–8.1)

## 2022-01-24 LAB — CBC WITH DIFFERENTIAL/PLATELET
Absolute Monocytes: 835 cells/uL (ref 200–950)
Basophils Absolute: 58 cells/uL (ref 0–200)
Basophils Relative: 0.6 %
Eosinophils Absolute: 106 cells/uL (ref 15–500)
Eosinophils Relative: 1.1 %
HCT: 45.7 % (ref 38.5–50.0)
Hemoglobin: 16.4 g/dL (ref 13.2–17.1)
Lymphs Abs: 1190 cells/uL (ref 850–3900)
MCH: 33.9 pg — ABNORMAL HIGH (ref 27.0–33.0)
MCHC: 35.9 g/dL (ref 32.0–36.0)
MCV: 94.4 fL (ref 80.0–100.0)
MPV: 10.6 fL (ref 7.5–12.5)
Monocytes Relative: 8.7 %
Neutro Abs: 7411 cells/uL (ref 1500–7800)
Neutrophils Relative %: 77.2 %
Platelets: 211 10*3/uL (ref 140–400)
RBC: 4.84 10*6/uL (ref 4.20–5.80)
RDW: 11.8 % (ref 11.0–15.0)
Total Lymphocyte: 12.4 %
WBC: 9.6 10*3/uL (ref 3.8–10.8)

## 2022-01-24 LAB — LIPID PANEL
Cholesterol: 200 mg/dL — ABNORMAL HIGH (ref <200)
HDL: 68 mg/dL (ref >=40)
LDL Cholesterol (Calc): 115 mg/dL (calc) — ABNORMAL HIGH
Non-HDL Cholesterol (Calc): 132 mg/dL (calc) — ABNORMAL HIGH (ref <130)
Total CHOL/HDL Ratio: 2.9 (calc) (ref <5.0)
Triglycerides: 74 mg/dL (ref <150)

## 2022-01-24 NOTE — Addendum Note (Signed)
Addended by: Jamie Kato III on: 01/24/2022 03:46 PM   Modules accepted: Level of Service

## 2022-01-31 ENCOUNTER — Other Ambulatory Visit: Payer: Self-pay | Admitting: Sports Medicine

## 2022-01-31 DIAGNOSIS — Z0000 Encounter for general adult medical examination without abnormal findings: Secondary | ICD-10-CM

## 2022-02-14 ENCOUNTER — Ambulatory Visit: Payer: BC Managed Care – PPO | Admitting: Neurology

## 2022-02-14 DIAGNOSIS — R202 Paresthesia of skin: Secondary | ICD-10-CM

## 2022-02-14 NOTE — Procedures (Signed)
[facility] Neurology  [address], [address]  [place], [place] [address] Tel: [phone] Fax: [phone] Test Date:  02/14/2022  Patient: Nicholas Bright DOB: 01/27/1961 Physician: Nita Sickle, DO  Sex: Male Height: 6\' 4"  Ref Phys: , MD  ID#: [id number]   Technician:    History: This is a 61 year old man referred for evaluation of bilateral feet numbness.  NCV & EMG Findings: Extensive electrodiagnostic testing of the right lower extremity and additional studies of the left shows:  Left sural sensory response is absent.  Right sural and bilateral superficial peroneal sensory responses show low-normal amplitudes. Bilateral peroneal and tibial motor responses are within normal limits. There is no evidence of active or chronic motor axonal loss changes affecting any of the tested muscles.  Motor unit configuration and recruitment pattern is within normal limits.  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   ___________________________ 62, DO    Nerve Conduction Studies   Stim Site NR Peak (ms) Norm Peak (ms) O-P Amp (V) Norm O-P Amp  Left Sup Peroneal Anti Sensory (Ant Lat Mall)  32 C  12 cm    3.5 <4.6 3.1 >3  Right Sup Peroneal Anti Sensory (Ant Lat Mall)  32 C  12 cm    4.0 <4.6 5.8 >3  Left Sural Anti Sensory (Lat Mall)  32 C  Calf *NR  <4.6  >3  Right Sural Anti Sensory (Lat Mall)  32 C  Calf    3.8 <4.6 4.5 >3     Stim Site NR Onset (ms) Norm Onset (ms) O-P Amp (mV) Norm O-P Amp Site1 Site2 Delta-0 (ms) Dist (cm) Vel (m/s) Norm Vel (m/s)  Left Peroneal Motor (Ext Dig Brev)  32 C  Ankle    4.6 <6.0 2.8 >2.5 B Fib Ankle 10.6 40.0 42 >40  B Fib    15.2  2.0  Poplt B Fib 2.0 10.0 50 >40  Poplt    17.2  1.8         Right Peroneal Motor (Ext Dig Brev)  32 C  Ankle    5.2 <6.0 3.4 >2.5 B Fib Ankle 10.8 41.0 40 >40  B  Fib    16.0  2.8  Poplt B Fib 2.0 10.0 50 >40  Poplt    18.0  2.7         Left Tibial Motor (Abd Hall Brev)  32 C  Ankle    5.5 <6.0 5.1 >4 Knee Ankle 12.3 44.0 41 >40  Knee    17.8  2.1         Right Tibial Motor (Abd Hall Brev)  32 C  Ankle    5.3 <6.0 9.7 >4 Knee Ankle 12.0 46.0 40 >40  Knee    17.3  4.9          Electromyography   Side Muscle Ins.Act Fibs Fasc Recrt Amp Dur Poly Activation Comment  Right AntTibialis Nml Nml Nml Nml Nml Nml Nml Nml N/A  Right Gastroc Nml Nml Nml Nml Nml Nml Nml Nml N/A  Right Flex Dig Long Nml Nml Nml Nml Nml Nml Nml Nml N/A  Right RectFemoris Nml Nml Nml Nml Nml Nml Nml Nml N/A  Right BicepsFemS Nml Nml Nml Nml Nml Nml Nml Nml N/A  Right GluteusMed Nml Nml Nml Nml Nml Nml Nml Nml N/A  Left AntTibialis Nml Nml Nml Nml Nml Nml Nml Nml N/A  Left Gastroc Nml Nml Nml Nml Nml Nml Nml Nml N/A  Left Flex Dig Long Nml Nml Nml Nml Nml Nml Nml Nml N/A  Left RectFemoris Nml Nml Nml Nml Nml Nml Nml Nml N/A  Left GluteusMed Nml Nml Nml Nml Nml Nml Nml Nml N/A  Left BicepsFemS Nml Nml Nml Nml Nml Nml Nml Nml N/A      Waveforms:

## 2022-03-06 ENCOUNTER — Other Ambulatory Visit (HOSPITAL_BASED_OUTPATIENT_CLINIC_OR_DEPARTMENT_OTHER): Payer: Self-pay | Admitting: Orthopaedic Surgery

## 2022-03-06 DIAGNOSIS — G5793 Unspecified mononeuropathy of bilateral lower limbs: Secondary | ICD-10-CM

## 2022-03-07 ENCOUNTER — Other Ambulatory Visit (HOSPITAL_BASED_OUTPATIENT_CLINIC_OR_DEPARTMENT_OTHER): Payer: Self-pay | Admitting: Orthopaedic Surgery

## 2022-03-07 DIAGNOSIS — G5793 Unspecified mononeuropathy of bilateral lower limbs: Secondary | ICD-10-CM

## 2022-03-08 ENCOUNTER — Ambulatory Visit: Payer: BC Managed Care – PPO | Admitting: Cardiology

## 2022-03-11 ENCOUNTER — Other Ambulatory Visit (HOSPITAL_BASED_OUTPATIENT_CLINIC_OR_DEPARTMENT_OTHER): Payer: Self-pay | Admitting: Orthopaedic Surgery

## 2022-03-11 DIAGNOSIS — G5793 Unspecified mononeuropathy of bilateral lower limbs: Secondary | ICD-10-CM

## 2022-03-12 ENCOUNTER — Encounter: Payer: Self-pay | Admitting: Neurology

## 2022-03-24 DIAGNOSIS — E785 Hyperlipidemia, unspecified: Secondary | ICD-10-CM | POA: Insufficient documentation

## 2022-03-24 DIAGNOSIS — R002 Palpitations: Secondary | ICD-10-CM | POA: Insufficient documentation

## 2022-03-24 DIAGNOSIS — R9431 Abnormal electrocardiogram [ECG] [EKG]: Secondary | ICD-10-CM | POA: Insufficient documentation

## 2022-03-24 LAB — COLOGUARD: COLOGUARD: NEGATIVE

## 2022-03-24 NOTE — Progress Notes (Unsigned)
Cardiology Office Note   Date:  03/25/2022   ID:  Nicholas Bright, DOB 06-03-1960, MRN [id number]  PCP:   Elba Barman, DO Cardiologist:   None   Chief Complaint  Patient presents with   Abnormal ECG     History of Present Illness: Nicholas Bright is a 62 y.o. male who was for evaluation of to see if he an abnormal EKG. Since I last saw him he has done well.   The patient denies any new symptoms such as chest discomfort, neck or arm discomfort. There has been no new shortness of breath, PND or orthopnea. There have been no reported palpitations, presyncope or syncope. He exercises routinely.    PMH: None  Past Surgical History:  Procedure Laterality Date   HIP SURGERY     LUMBAR DISC SURGERY     OPEN REDUCTION INTERNAL FIXATION (ORIF) METACARPAL Left 03/14/2021   Procedure: OPEN REDUCTION INTERNAL FIXATION (ORIF) LEFT 5TH METACARPAL;  Surgeon: Sherilyn Cooter, MD;  Location: [place];  Service: Orthopedics;  Laterality: Left;   ORIF ANKLE FRACTURE Left 03/14/2021   Procedure: OPEN REDUCTION INTERNAL FIXATION (ORIF) ANKLE FRACTURE;  Surgeon: Vanetta Mulders, MD;  Location: [facility];  Service: Orthopedics;  Laterality: Left;   SKIN BIOPSY Bilateral 08/17/2020   pigmented seborrheic keratosis, irritated     Current Outpatient Medications  Medication Sig Dispense Refill   Multiple Vitamins-Minerals (PRESERVISION AREDS 2 PO) Take 1 tablet by mouth 4 (four) times a week.     azithromycin (ZITHROMAX) 250 MG tablet Take 2 tablets on day 1, then take 1 tablet for the remaining 4 days (Patient not taking: Reported on 03/25/2022) 6 each 0   Cholecalciferol (VITAMIN D3 PO) Take 1 tablet by mouth 4 (four) times a week. (Patient not taking: Reported on 03/25/2022)     Cyanocobalamin (VITAMIN B-12 PO) Take 1 tablet by mouth 4 (four) times a week. (Patient not taking: Reported on 03/25/2022)     meloxicam (MOBIC) 15 MG tablet Take 1 tablet (15 mg total) by mouth daily. (Patient not taking: Reported  on 03/25/2022) 20 tablet 0   Multiple Vitamin (MULTIVITAMIN WITH MINERALS) TABS tablet Take 1 tablet by mouth 4 (four) times a week. (Patient not taking: Reported on 03/25/2022)     Multiple Vitamins-Minerals (AIRBORNE PO) Take 1 tablet by mouth daily as needed (with travels/flying). (Patient not taking: Reported on 03/25/2022)     Multiple Vitamins-Minerals (ZINC PO) Take 1 tablet by mouth 4 (four) times a week. (Patient not taking: Reported on 03/25/2022)     No current facility-administered medications for this visit.    Allergies:   Patient has no known allergies.    ROS:  Please see the history of present illness.   Otherwise, review of systems are positive for none.   All other systems are reviewed and negative.    PHYSICAL EXAM: VS:  BP 138/86   Pulse 67   Ht 6\' 3"  (1.905 m)   Wt 216 lb 12.8 oz (98.3 kg)   SpO2 99%   BMI 27.10 kg/m  , BMI Body mass index is 27.1 kg/m. GENERAL:  Well appearing NECK:  No jugular venous distention, waveform within normal limits, carotid upstroke brisk and symmetric, no bruits, no thyromegaly LUNGS:  Clear to auscultation bilaterally CHEST:  Unremarkable HEART:  PMI not displaced or sustained,S1 and S2 within normal limits, no S3, no S4, no clicks, no rubs, no murmurs ABD:  Flat, positive bowel sounds normal in frequency  in pitch, no bruits, no rebound, no guarding, no midline pulsatile mass, no hepatomegaly, no splenomegaly EXT:  2 plus pulses throughout, no edema, no cyanosis no clubbing  EKG:  EKG is  ordered today. The ekg ordered today demonstrates sinus rhythm, rate 67, axis within normal limits, intervals within normal limits, no acute ST-T wave changes.   Recent Labs: 01/23/2022: ALT 29; BUN 11; Creat 0.88; Hemoglobin 16.4; Platelets 211; Potassium 4.4; Sodium 139    Lipid Panel    Component Value Date/Time   CHOL 200 (H) 01/23/2022 0856   CHOL 178 04/18/2020 1624   TRIG 74 01/23/2022 0856   HDL 68 01/23/2022 0856   HDL 63  04/18/2020 1624   CHOLHDL 2.9 01/23/2022 0856   LDLCALC 115 (H) 01/23/2022 0856      Wt Readings from Last 3 Encounters:  03/25/22 216 lb 12.8 oz (98.3 kg)  01/23/22 212 lb 6.4 oz (96.3 kg)  03/14/21 205 lb (93 kg)      Other studies Reviewed: Additional studies/ records that were reviewed today include: None. Review of the above records demonstrates:  Please see elsewhere in the note.     ASSESSMENT AND PLAN:  Abnormal EKG: Patient had RSR prime V1 and V2.  There are no other acute pathologic findings.  No change in therapy.   Risk reduction:    I will be checking an LP(a).  Calcium score was minimally elevated.  HTN: His blood pressure is mildly elevated.  He is going to keep a blood pressure diary and I gave him the brand of low blood pressure cuff to 5.  It is likely that he is getting needs therapy and might start with an ARB but will defer to his primary.  Dyslipidemia: His LDL is 115 with an HDL of 68.  No change in therapy.   Palpitations:   He has no symptoms related to this.  No change in therapy.  Current medicines are reviewed at length with the patient today.  The patient does not have concerns regarding medicines.  The following changes have been made:  None  Labs/ tests ordered today include:      Orders Placed This Encounter  Procedures   Lipoprotein A (LPA)   EKG 12-Lead     Disposition:   FU with me in 12 months.    Signed, Minus Breeding, MD  03/25/2022 10:18 AM    [place]

## 2022-03-25 ENCOUNTER — Ambulatory Visit: Payer: BC Managed Care – PPO | Attending: Cardiology | Admitting: Cardiology

## 2022-03-25 ENCOUNTER — Encounter: Payer: Self-pay | Admitting: Cardiology

## 2022-03-25 VITALS — BP 138/86 | HR 67 | Ht 75.0 in | Wt 216.8 lb

## 2022-03-25 DIAGNOSIS — E785 Hyperlipidemia, unspecified: Secondary | ICD-10-CM | POA: Diagnosis not present

## 2022-03-25 DIAGNOSIS — R002 Palpitations: Secondary | ICD-10-CM

## 2022-03-25 DIAGNOSIS — R9431 Abnormal electrocardiogram [ECG] [EKG]: Secondary | ICD-10-CM

## 2022-03-25 NOTE — Patient Instructions (Signed)
Medication Instructions:  NO CHANGES  *If you need a refill on your cardiac medications before your next appointment, please call your pharmacy*  Follow-Up: At [facility], you and your health needs are our priority.  As part of our continuing mission to provide you with exceptional heart care, we have created designated Provider Care Teams.  These Care Teams include your primary Cardiologist (physician) and Advanced Practice Providers (APPs -  Physician Assistants and Nurse Practitioners) who all work together to provide you with the care you need, when you need it.  We recommend signing up for the patient portal called "MyChart".  Sign up information is provided on this After Visit Summary.  MyChart is used to connect with patients for Virtual Visits (Telemedicine).  Patients are able to view lab/test results, encounter notes, upcoming appointments, etc.  Non-urgent messages can be sent to your provider as well.   To learn more about what you can do with MyChart, go to [email or web address].    Your next appointment:    12 months with Dr. Hochrein 

## 2022-03-26 LAB — LIPOPROTEIN A (LPA): Lipoprotein (a): 24 nmol/L (ref <75.0)

## 2022-04-01 IMAGING — RF DG ANKLE COMPLETE 3+V*L*
1 series · 9 of 9 positions shown · non-contrast
Comparison: None.

CLINICAL DATA: ORIF left ankle.

EXAM:
LEFT ANKLE COMPLETE - 3+ VIEW

[Series 1: run · 9 of 9 slices shown]
[im 1/9]
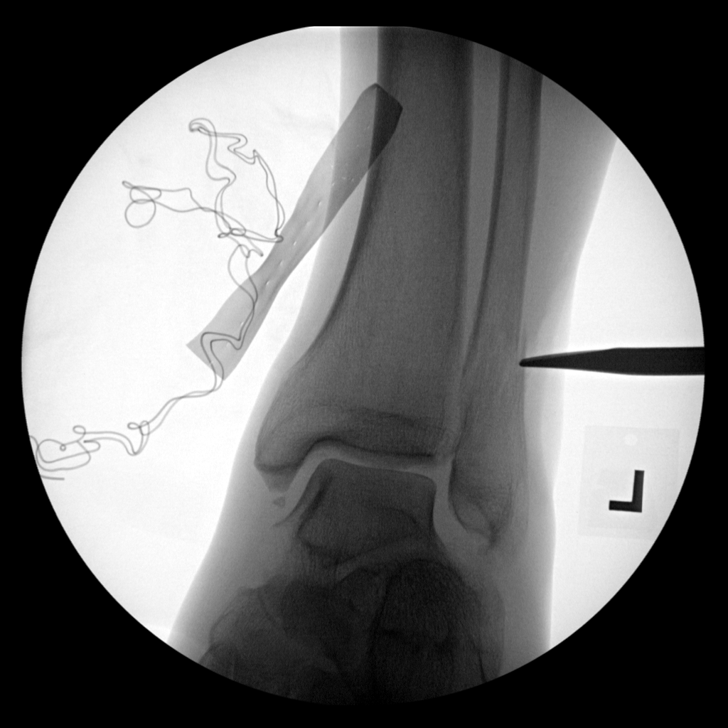
[im 2/9]
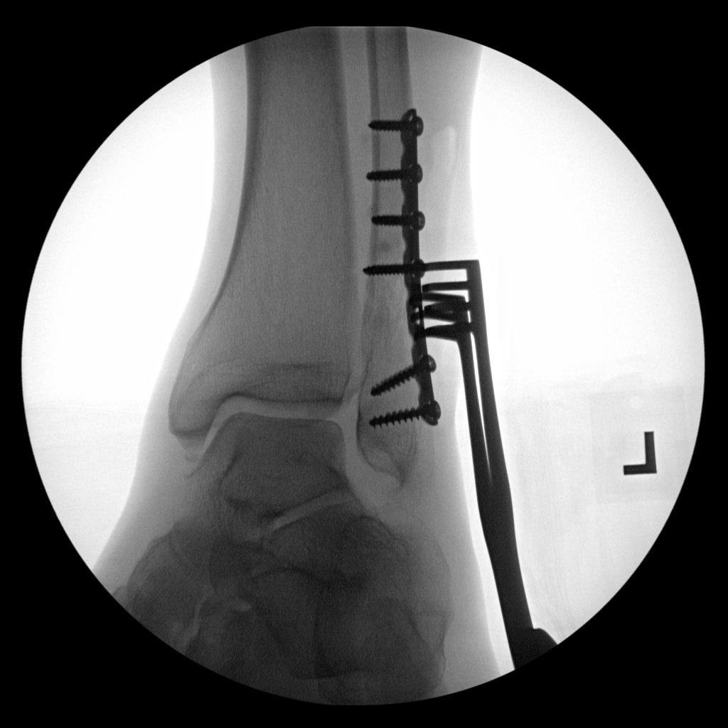
[im 3/9]
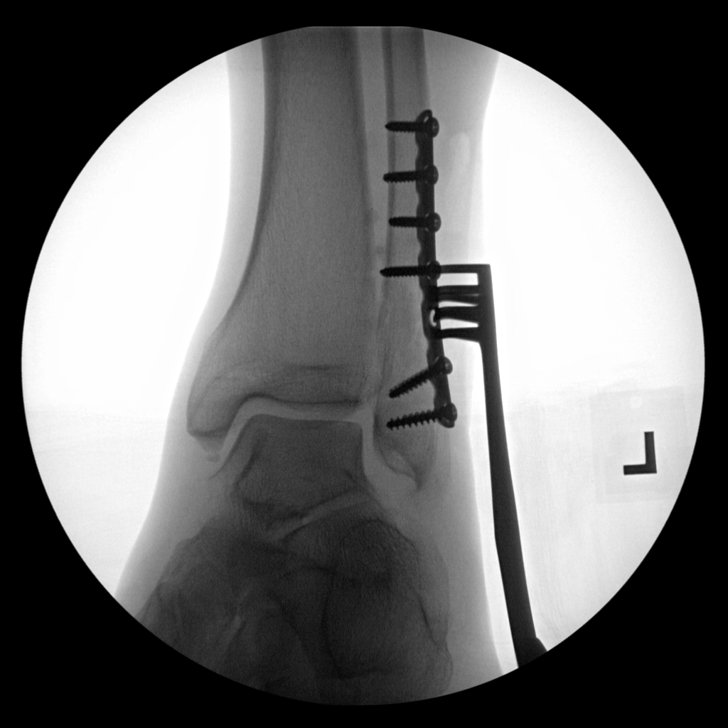
[im 4/9]
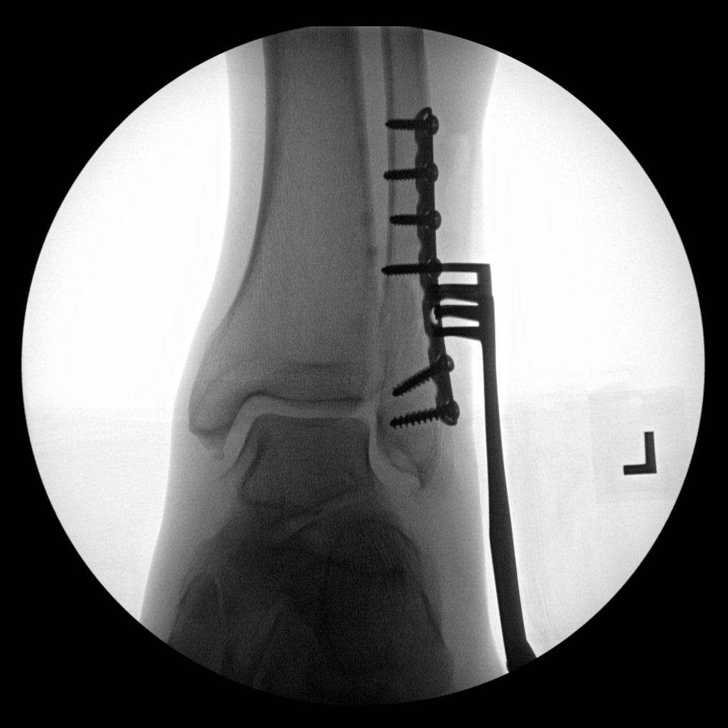
[im 5/9]
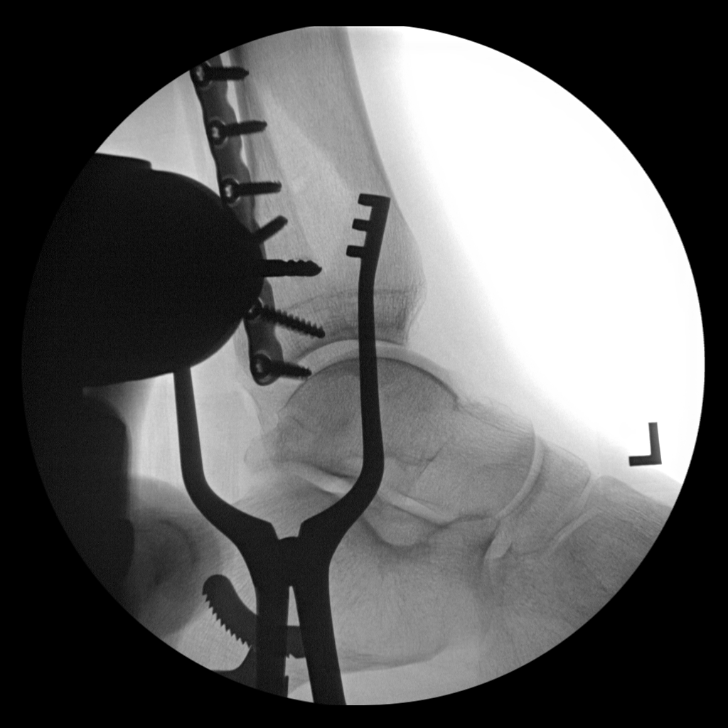
[im 6/9]
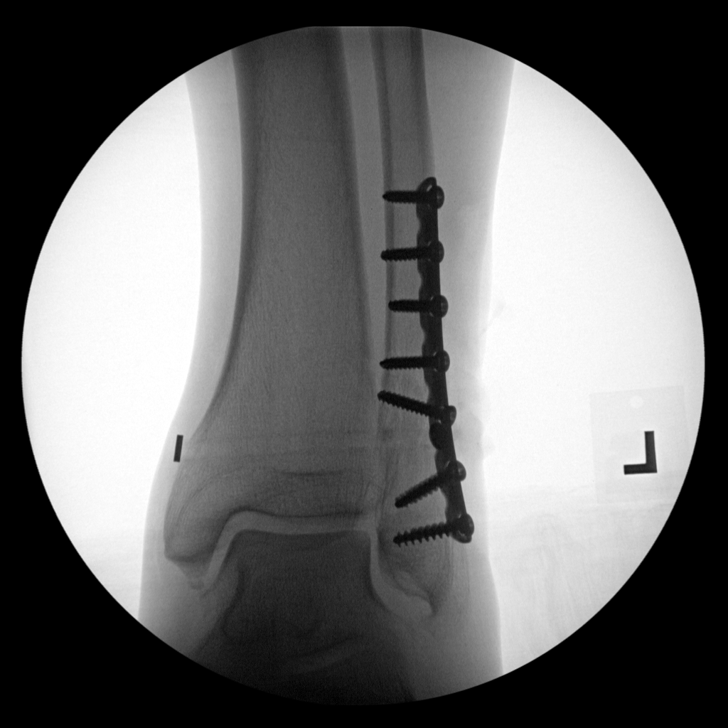
[im 7/9]
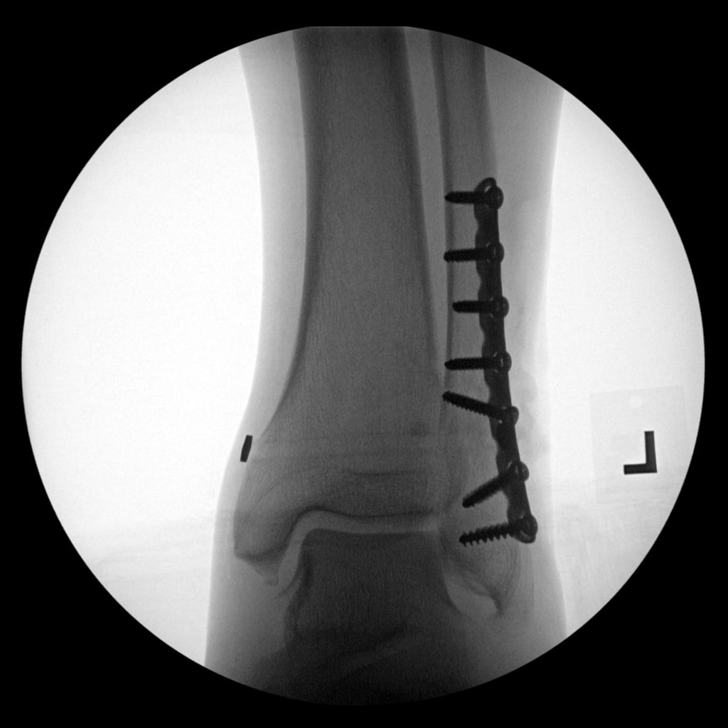
[im 8/9]
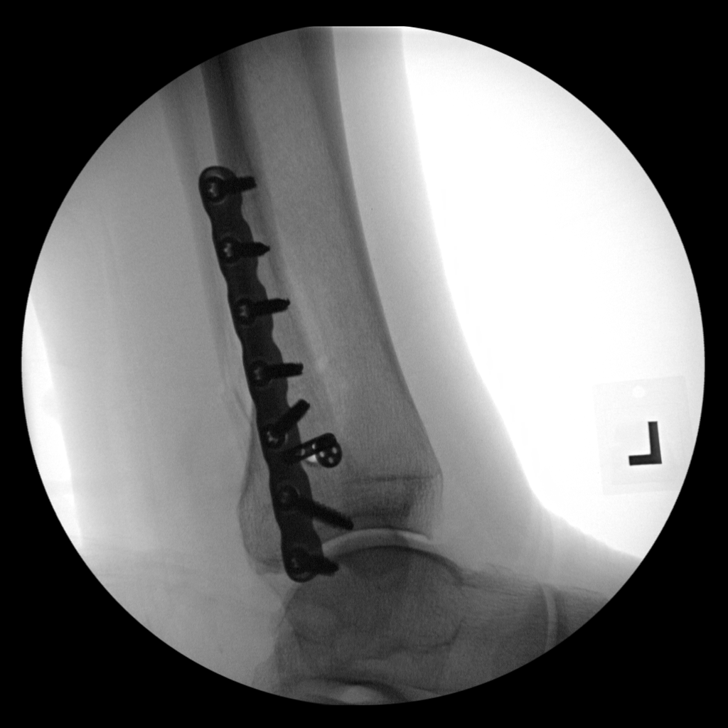
[im 9/9]
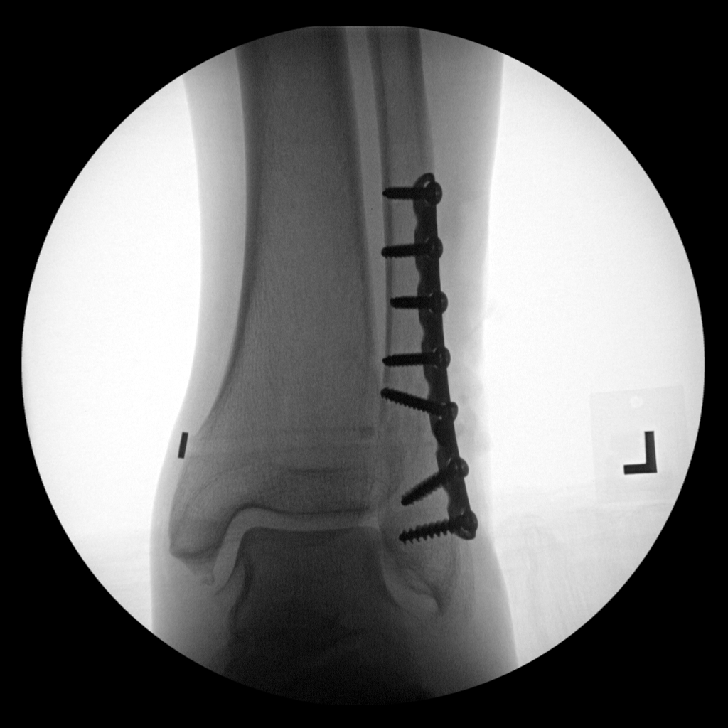

[9 of 9 positions shown; findings below may reference images not displayed]

FINDINGS: Intraoperative left ankle.

Nine low resolution intraoperative spot views of the left ankle were
obtained. Fibular sideplate and multiple screws are present fixating
fibular fracture. Alignment is anatomic. Fracture tip of the medial
malleolus is present. Joint spaces are maintained.

Total fluoroscopy time: 1 minute 31 seconds

Total radiation dose: 3.38 micro Gy
IMPRESSION: Intraoperative ORIF fibular fracture.

## 2022-04-05 ENCOUNTER — Encounter: Payer: Self-pay | Admitting: *Deleted

## 2022-04-08 NOTE — Progress Notes (Unsigned)
[place] Neurology Division Clinic Note - Initial Visit   Date: 04/09/2022   Nicholas Bright MRN: [id number] DOB: Nov 17, 1960   Dear Dr Sammuel Hines:  Thank you for your kind referral of Nicholas Bright for consultation of neuropathy. Although his history is well known to you, please allow [place] to reiterate it for the purpose of our medical record. The patient was accompanied to the clinic by self.    Nicholas Bright is a 62 y.o. right-handed male presenting for evaluation of bilateral feet numbness.   IMPRESSION/PLAN: Early and distal sensory neuropathy affecting the soles of the feet.  Risk factors:  alcohol. NCS/EMG reviewed and shows mild sensory loss in the feet.  His neurological examination shows findings consistent with large fiber peripheral neuropathy. I had extensive discussion with the patient regarding the pathogenesis, etiology, management, and natural course of neuropathy. Neuropathy tends to be slowly progressive, especially if a treatable etiology is not identified.  I would like to test for treatable causes of neuropathy. Check vitamin B12, folate, vitamin B1, copper, TSH, SPEP with IFE.  He has a history of alcohol use (2-3 drinks daily) and I advised that he try to reduce or abstain to see if this will stabilize symptoms.  Management is supportive.  Unfortunately, there is no medication to manage numbness.  He denies pain, weakness, or imbalance.    Return to clinic in 1 year  ------------------------------------------------------------- History of present illness: Starting around ~2019, he began having numbness involving the soles of the feet which is constant, slightly worse in the left foot.  He denies weakness or imbalance.  No burning, stabbing, or stinging pain.  Father passed away at 71 and later in life developed neuropathy.  He has history of lumbar surgery at L4-5 in 2004.  He also fractured his left ankle in 2023 and had surgery for this.    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  He lives alone in a two-level home.  He drinks 2-3 glasses wine daily.    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    History reviewed. No pertinent past medical history.  Past Surgical History:  Procedure Laterality Date   HIP SURGERY     LUMBAR DISC SURGERY     OPEN REDUCTION INTERNAL FIXATION (ORIF) METACARPAL Left 03/14/2021   Procedure: OPEN REDUCTION INTERNAL FIXATION (ORIF) LEFT 5TH METACARPAL;  Surgeon: Sherilyn Cooter, MD;  Location: [facility];  Service: Orthopedics;  Laterality: Left;   ORIF ANKLE FRACTURE Left 03/14/2021   Procedure: OPEN REDUCTION INTERNAL FIXATION (ORIF) ANKLE FRACTURE;  Surgeon: Vanetta Mulders, MD;  Location: [facility];  Service: Orthopedics;  Laterality: Left;   SKIN BIOPSY Bilateral 08/17/2020   pigmented seborrheic keratosis, irritated     Medications:  Outpatient Encounter Medications as of 04/09/2022  Medication Sig   azithromycin (ZITHROMAX) 250 MG tablet Take 2 tablets on day 1, then take 1 tablet for the remaining 4 days (Patient not taking: Reported on 03/25/2022)   Cholecalciferol (VITAMIN D3 PO) Take 1 tablet by mouth 4 (four) times a week. (Patient not taking: Reported on 03/25/2022)   Cyanocobalamin (VITAMIN B-12 PO) Take 1 tablet by mouth 4 (four) times a week. (Patient not taking: Reported on 03/25/2022)   meloxicam (MOBIC) 15 MG tablet Take 1 tablet (15 mg total) by mouth daily. (  Patient not taking: Reported on 03/25/2022)   Multiple Vitamin (MULTIVITAMIN WITH MINERALS) TABS tablet Take 1 tablet by mouth 4 (four) times a week. (Patient not taking: Reported on 03/25/2022)   Multiple Vitamins-Minerals (AIRBORNE PO) Take 1 tablet by mouth daily as needed (with travels/flying). (Patient  not taking: Reported on 03/25/2022)   Multiple Vitamins-Minerals (PRESERVISION AREDS 2 PO) Take 1 tablet by mouth 4 (four) times a week. (Patient not taking: Reported on 04/09/2022)   Multiple Vitamins-Minerals (ZINC PO) Take 1 tablet by mouth 4 (four) times a week. (Patient not taking: Reported on 03/25/2022)   No facility-administered encounter medications on file as of 04/09/2022.    Allergies: No Known Allergies  Family History: Family History  Problem Relation Age of Onset   Heart Problems Mother        Heart Stent   Congestive Heart Failure Father    CAD Father 50   Stroke Maternal Grandfather    Heart attack Paternal Grandfather     Social History: Social History   Tobacco Use   Smoking status: Never   Smokeless tobacco: Never  Vaping Use   Vaping Use: Never used  Substance Use Topics   Alcohol use: Yes    Alcohol/week: 10.0 standard drinks of alcohol    Types: 4 Glasses of wine, 6 Cans of beer per week   Drug use: Never   Social History   Social History Narrative   Single, 3 children.  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      Right handed   Lives in a two story home     Vital Signs:  BP (!) 154/97   Pulse 82   Ht 6' 3"$  (1.905 m)   Wt 215 lb (97.5 kg)   SpO2 95%   BMI 26.87 kg/m    Neurological Exam: MENTAL STATUS including orientation to time, place, person, recent and remote memory, attention span and concentration, language, and fund of knowledge is normal.  Speech is not dysarthric.  CRANIAL NERVES: II:  No visual field defects.     III-IV-VI: Pupils equal round and reactive to light.  Normal conjugate, extra-ocular eye movements in all directions of gaze.  No nystagmus.  No ptosis.   V:  Normal facial sensation.    VII:  Normal facial symmetry and movements.   VIII:  Normal hearing and vestibular function.   IX-X:  Normal palatal movement.   XI:  Normal shoulder shrug and head rotation.   XII:  Normal tongue strength and range of motion, no  deviation or fasciculation.  MOTOR:  No atrophy, fasciculations or abnormal movements.  No pronator drift.   Upper Extremity:  Right  Left  Deltoid  5/5   5/5   Biceps  5/5   5/5   Triceps  5/5   5/5   Wrist extensors  5/5   5/5   Wrist flexors  5/5   5/5   Finger extensors  5/5   5/5   Finger flexors  5/5   5/5   Dorsal interossei  5/5   5/5   Abductor pollicis  5/5   5/5   Tone (Ashworth scale)  0  0   Lower Extremity:  Right  Left  Hip flexors  5/5   5/5   Knee flexors  5/5   5/5   Knee extensors  5/5   5/5   Dorsiflexors  5/5   5/5   Plantarflexors  5/5   5/5   Toe extensors  5/5   5/5   Toe flexors  5/5   5/5   Tone (Ashworth scale)  0  0   MSRs:                                           Right        Left brachioradialis 2+  2+  biceps 2+  2+  triceps 2+  2+  patellar 2+  2+  ankle jerk 1+  1+  Hoffman no  no  plantar response down  down   SENSORY:  Reduced vibration 30% at the great toes bilaterally.  Pin prick and temperature intact throughout.  Romberg's sign absent.   COORDINATION/GAIT: Normal finger-to- nose-finger.  Intact rapid alternating movements bilaterally. Gait narrow based and stable. Tandem and stressed gait intact.   Total time spent reviewing records, interview, history/exam, documentation, and coordination of care on day of encounter:  40 min   Thank you for allowing me to participate in patient's care.  If I can answer any additional questions, I would be pleased to do so.    Sincerely,    Gena Laski K. Posey Pronto, DO

## 2022-04-09 ENCOUNTER — Ambulatory Visit: Payer: BC Managed Care – PPO | Admitting: Neurology

## 2022-04-09 ENCOUNTER — Other Ambulatory Visit (INDEPENDENT_AMBULATORY_CARE_PROVIDER_SITE_OTHER): Payer: BC Managed Care – PPO

## 2022-04-09 ENCOUNTER — Encounter: Payer: Self-pay | Admitting: Neurology

## 2022-04-09 VITALS — BP 154/97 | HR 82 | Ht 75.0 in | Wt 215.0 lb

## 2022-04-09 DIAGNOSIS — G629 Polyneuropathy, unspecified: Secondary | ICD-10-CM | POA: Diagnosis not present

## 2022-04-09 LAB — B12 AND FOLATE PANEL
Folate: 22.9 ng/mL (ref >5.9)
Vitamin B-12: 294 pg/mL (ref 211–911)

## 2022-04-09 LAB — TSH: TSH: 2.12 u[IU]/mL (ref 0.35–5.50)

## 2022-04-09 NOTE — Patient Instructions (Signed)
It was great to see you today!  Check labs today  Return to clinic in 1 year

## 2022-04-10 ENCOUNTER — Ambulatory Visit: Payer: BC Managed Care – PPO | Admitting: Neurology

## 2022-04-23 LAB — IMMUNOFIXATION ELECTROPHORESIS
IgG (Immunoglobin G), Serum: 1201 mg/dL (ref 600–1540)
IgM, Serum: 161 mg/dL (ref 50–300)
Immunoglobulin A: 309 mg/dL (ref 70–320)

## 2022-04-23 LAB — PROTEIN ELECTROPHORESIS, SERUM
Albumin ELP: 4.5 g/dL (ref 3.8–4.8)
Alpha 1: 0.3 g/dL (ref 0.2–0.3)
Alpha 2: 0.6 g/dL (ref 0.5–0.9)
Beta 2: 0.4 g/dL (ref 0.2–0.5)
Beta Globulin: 0.5 g/dL (ref 0.4–0.6)
Gamma Globulin: 1.1 g/dL (ref 0.8–1.7)
Total Protein: 7.3 g/dL (ref 6.1–8.1)

## 2022-04-23 LAB — COPPER, SERUM: Copper: 100 ug/dL (ref 70–175)

## 2022-04-23 LAB — VITAMIN B1: Vitamin B1 (Thiamine): 14 nmol/L (ref 8–30)

## 2022-04-24 ENCOUNTER — Telehealth: Payer: Self-pay | Admitting: Neurology

## 2022-04-24 NOTE — Telephone Encounter (Signed)
Called patient and left a message for a call back.  

## 2022-04-24 NOTE — Telephone Encounter (Signed)
Pt returned call

## 2022-04-24 NOTE — Telephone Encounter (Signed)
Please notify patient lab are within normal limits.  Thank you.

## 2022-04-25 ENCOUNTER — Telehealth: Payer: Self-pay | Admitting: Neurology

## 2022-04-25 NOTE — Telephone Encounter (Signed)
Called patient and left a message for a call back.  

## 2022-04-25 NOTE — Telephone Encounter (Signed)
Pt called in returning Mahina's call about results

## 2022-04-25 NOTE — Telephone Encounter (Signed)
See previous encounter

## 2022-04-26 NOTE — Telephone Encounter (Signed)
Patient is returning a call to [facility].

## 2022-04-26 NOTE — Telephone Encounter (Signed)
Called patient and left a detailed message per DPR that labs are normal. Left contact information if patient had any questions or concerns.

## 2022-05-01 ENCOUNTER — Ambulatory Visit: Payer: BC Managed Care – PPO | Admitting: Sports Medicine

## 2022-05-01 ENCOUNTER — Encounter: Payer: Self-pay | Admitting: Sports Medicine

## 2022-05-01 DIAGNOSIS — J019 Acute sinusitis, unspecified: Secondary | ICD-10-CM | POA: Diagnosis not present

## 2022-05-01 MED ORDER — AZITHROMYCIN 250 MG PO TABS: ORAL_TABLET | ORAL | 0 refills | Status: DC

## 2022-05-01 NOTE — Progress Notes (Signed)
Nicholas Bright - 62 y.o. male MRN [id number]  Date of birth: 02-25-1961  Office Visit Note: Visit Date: 05/01/2022 PCP: System, Provider Not In Referred by: No ref. provider found  Subjective: No chief complaint on file.  HPI: Nicholas Bright is a pleasant 62 y.o. male who presents today for nasal congestion, drainage, sinus pressure.   Symptoms started over a week ago on the weekend.  Nasal congestion, some nasal drainage and throat discomfort.  Initially had been getting better, however over the last few days feels like his symptoms are worsening.  Also endorsing fatigue, sinus pressure.  He does not have any shortness of breath but has been coughing up some green mucus.  No documented fevers or chills.  Pertinent ROS were reviewed with the patient and found to be negative unless otherwise specified above in HPI.   Assessment & Plan: Visit Diagnoses:  1. Acute rhinosinusitis    Plan: Discussed with [place] he has signs and symptoms of acute rhinosinusitis, his symptoms have been prolonged for over a week now, he does have some symptoms of possibly double sickening as he is now getting worse.  Given this, we will proceed with 3-day azithromycin pack as he has received good response from this in the past.  IM methylprednisolone '80mg'$  provided in right glute today for symptom management.  He will follow-up with me as needed.  Discussed importance of fluid hydration, sleep and supportive care.  Follow-up: Return if symptoms worsen or fail to improve.   Meds & Orders:  Meds ordered this encounter  Medications   azithromycin (ZITHROMAX) 250 MG tablet    Sig: Take 2 tablets on day 1, then 1 tablet the next 2 days    Dispense:  4 each    Refill:  0   No orders of the defined types were placed in this encounter.    Procedures: No procedures performed      Clinical History: No specialty comments available.  He reports that he has never smoked. He has never used smokeless tobacco. No results  for input(s): "HGBA1C", "LABURIC" in the last 8760 hours.  Objective:   Vital Signs:  - HR: 72 - RR: 14  Physical Exam  Gen: Well-appearing, in no acute distress; non-toxic CV: Regular Rate. Well-perfused. Warm.  Regular rate and rhythm, normal S1 and S2. Resp: Breathing unlabored on room air; lungs clear to auscultation anteriorly and posteriorly, there is some upper airway congestion noted. Psych: Fluid speech in conversation; appropriate affect; normal thought process Neuro: Sensation intact throughout. No gross coordination deficits.   Imaging: No results found.  Past Medical/Family/Surgical/Social History: Medications & Allergies reviewed per EMR, new medications updated. 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 No pertinent past medical history. Family History  Problem Relation Age of Onset   Heart Problems Mother        Heart Stent   Congestive Heart Failure Father    CAD Father 50   Stroke Maternal Grandfather    Heart  attack Paternal Grandfather    Past Surgical History:  Procedure Laterality Date   HIP SURGERY     LUMBAR DISC SURGERY     OPEN REDUCTION INTERNAL FIXATION (ORIF) METACARPAL Left 03/14/2021   Procedure: OPEN REDUCTION INTERNAL FIXATION (ORIF) LEFT 5TH METACARPAL;  Surgeon: Sherilyn Cooter, MD;  Location: [facility];  Service: Orthopedics;  Laterality: Left;   ORIF ANKLE FRACTURE Left 03/14/2021   Procedure: OPEN REDUCTION  INTERNAL FIXATION (ORIF) ANKLE FRACTURE;  Surgeon: Vanetta Mulders, MD;  Location: [place];  Service: Orthopedics;  Laterality: Left;   SKIN BIOPSY Bilateral 08/17/2020   pigmented seborrheic keratosis, irritated   Social History   Occupational History   Not on file  Tobacco Use   Smoking status: Never   Smokeless tobacco: Never  Vaping Use   Vaping Use: Never used  Substance and Sexual Activity   Alcohol use: Yes    Alcohol/week: 10.0 standard drinks of alcohol    Types: 4 Glasses of wine, 6 Cans of beer per week   Drug use: Never   Sexual activity: Yes    Partners: Female    Birth control/protection: None

## 2022-06-24 IMAGING — DX DG ANKLE COMPLETE 3+V*L*
3 series · 3 of 3 positions shown · non-contrast
Comparison: X-ray 04/23/2021.

CLINICAL DATA: Postop left ankle ORIF followup at 12 weeks.

EXAM:
LEFT ANKLE COMPLETE - 3+ VIEW

[ankle ap]
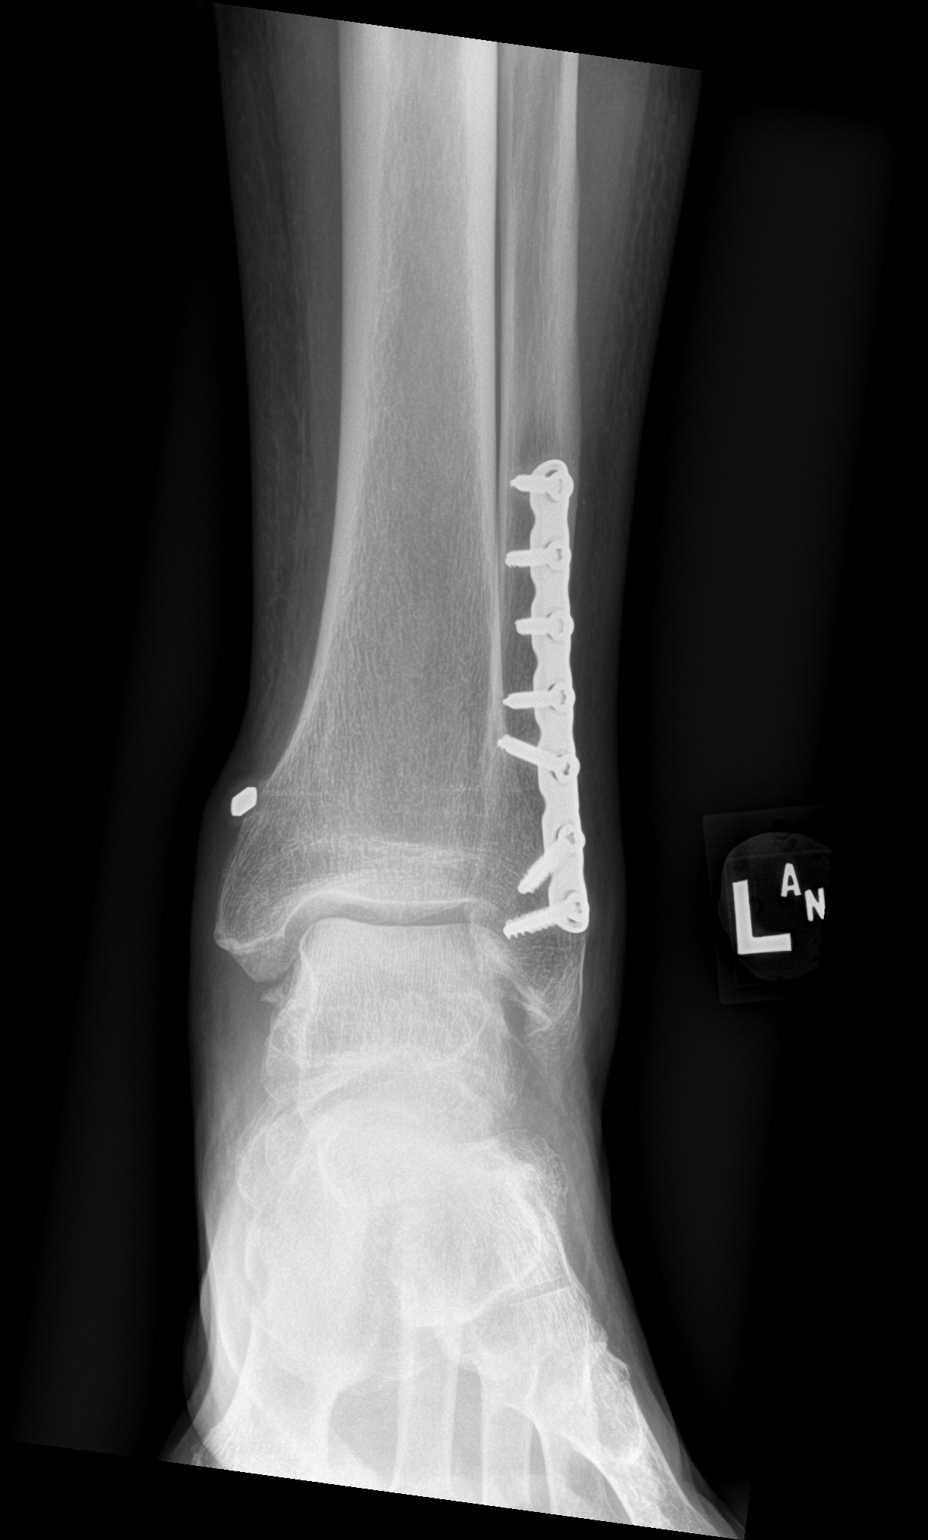

[ankle obl]
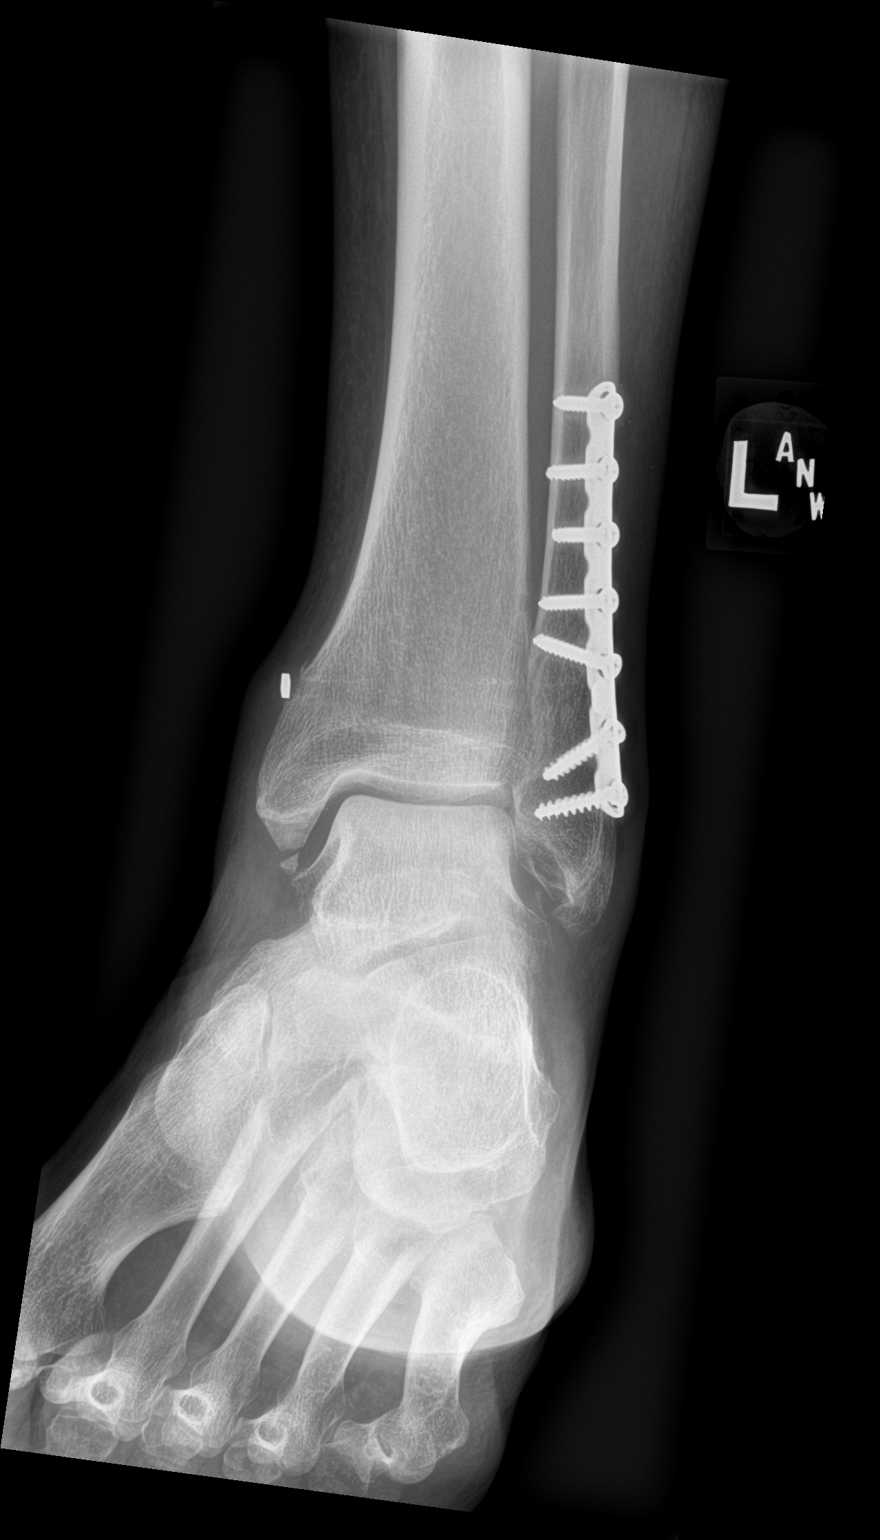

[ankle lat]
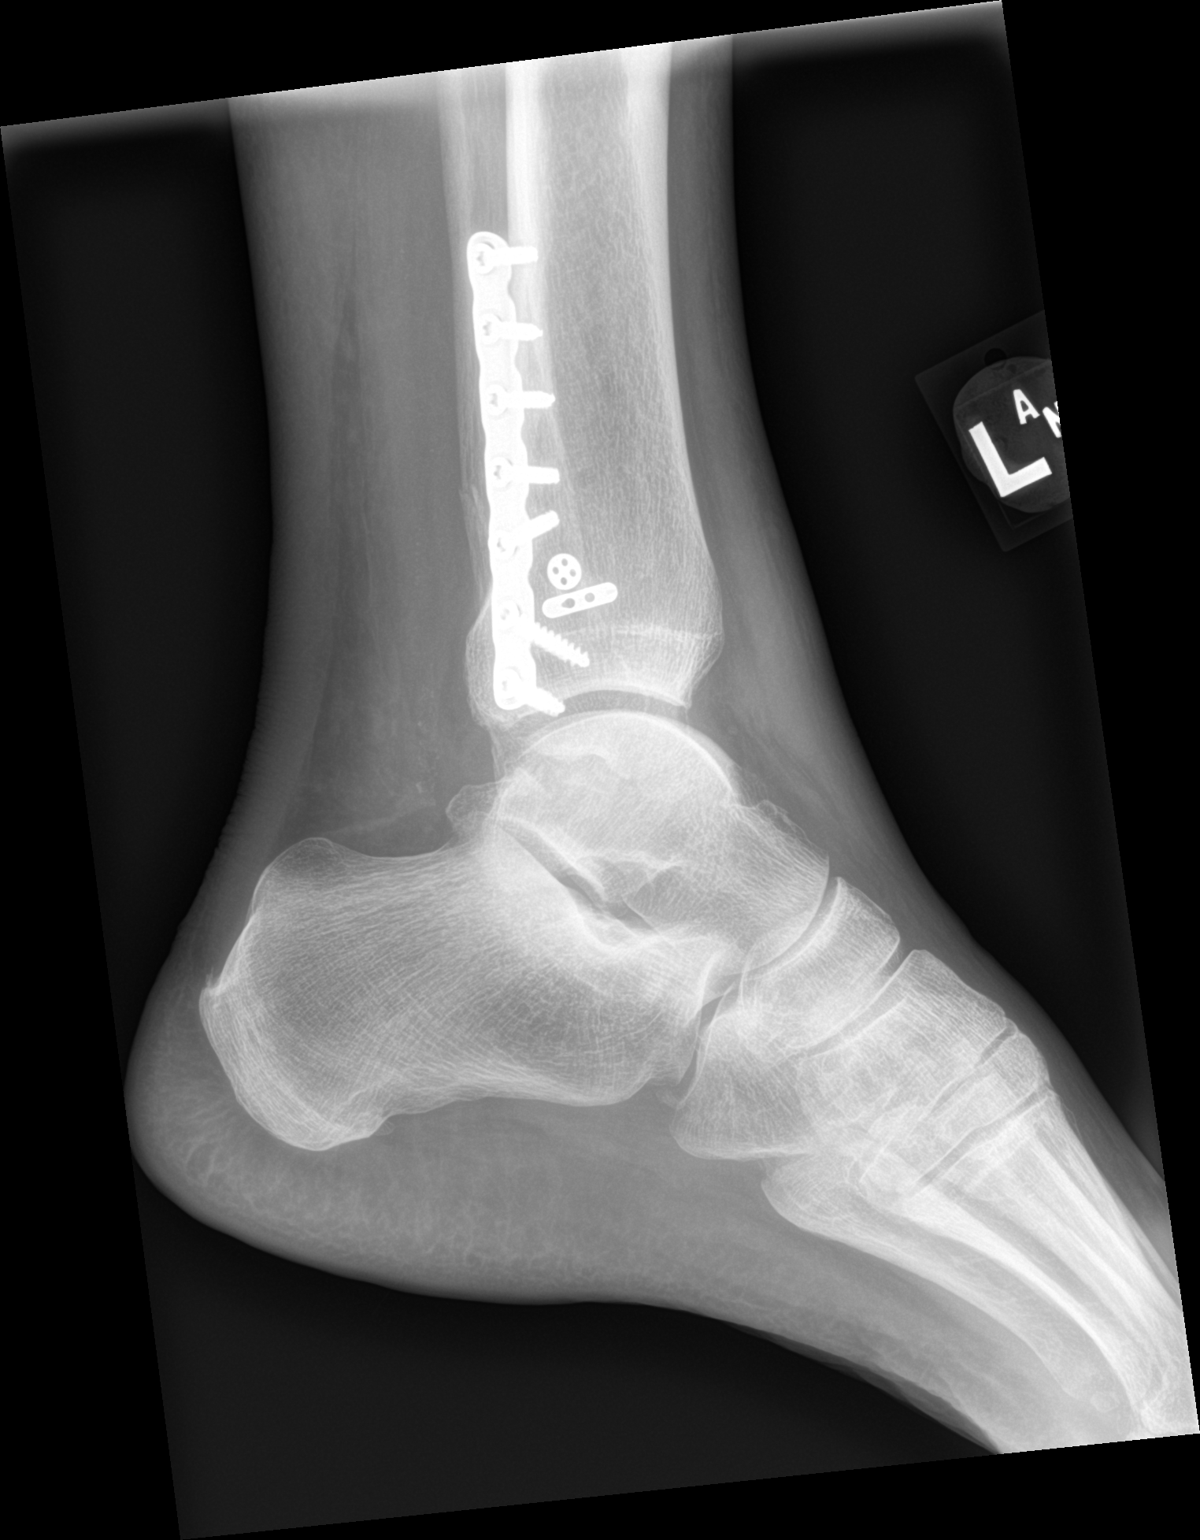

[3 of 3 positions shown; findings below may reference images not displayed]

FINDINGS: Redemonstration of lateral plate and screw fixation of the distal
fibula. There is progressive healing sclerosis with mild persistent
fracture line lucency within the distal fibular diaphysis. Distal
tibiofibular syndesmotic fixation tight rope is again noted.
Minimally displaced fracture of the distal tip of the medial
malleolus unchanged. Distal fibular and adjacent Jelekeni moderate
degenerative spurring.

The ankle mortise remains symmetric and intact.
IMPRESSION: :
IMPRESSION: 1. Status post ORIF of the distal fibula and distal tibiofibular
syndesmosis without evidence of hardware failure.
2. Mild progressive distal fibular fracture line healing. Normal
alignment.
3. Unchanged many 3 mm ossicle just distal to the medial malleolus.

## 2022-07-23 ENCOUNTER — Encounter (HOSPITAL_BASED_OUTPATIENT_CLINIC_OR_DEPARTMENT_OTHER): Payer: Self-pay | Admitting: Orthopaedic Surgery

## 2022-07-23 ENCOUNTER — Other Ambulatory Visit: Payer: Self-pay

## 2022-07-23 NOTE — Progress Notes (Signed)
Pt states hardware removal sx is to be done on left ankle, not right. Left message for April, at Dr. Serena Croissant office to notify of this.

## 2022-07-28 ENCOUNTER — Encounter (HOSPITAL_BASED_OUTPATIENT_CLINIC_OR_DEPARTMENT_OTHER): Payer: Self-pay | Admitting: Orthopaedic Surgery

## 2022-07-28 NOTE — Anesthesia Preprocedure Evaluation (Signed)
Anesthesia Evaluation  Patient identified by MRN, date of birth, ID band Patient awake    Reviewed: Allergy & Precautions, H&P , NPO status , Patient's Chart, lab work & pertinent test results  Airway Mallampati: II  TM Distance: >3 FB Neck ROM: Full    Dental no notable dental hx.    Pulmonary neg pulmonary ROS   Pulmonary exam normal breath sounds clear to auscultation       Cardiovascular hypertension, Normal cardiovascular exam Rhythm:Regular Rate:Normal  Hx/o Palpitations   Neuro/Psych Peripheral neuropathy negative neurological ROS  negative psych ROS   GI/Hepatic negative GI ROS, Neg liver ROS,,,  Endo/Other  negative endocrine ROS    Renal/GU negative Renal ROS  negative genitourinary   Musculoskeletal negative musculoskeletal ROS (+)  Painful hardware left ankle   Abdominal   Peds negative pediatric ROS (+)  Hematology negative hematology ROS (+)   Anesthesia Other Findings   Reproductive/Obstetrics negative OB ROS                             Anesthesia Physical Anesthesia Plan  ASA: 2  Anesthesia Plan: General   Post-op Pain Management: Regional block* and Minimal or no pain anticipated   Induction: Intravenous  PONV Risk Score and Plan: 2 and Treatment may vary due to age or medical condition  Airway Management Planned: LMA  Additional Equipment: None  Intra-op Plan:   Post-operative Plan: Extubation in OR  Informed Consent: I have reviewed the patients History and Physical, chart, labs and discussed the procedure including the risks, benefits and alternatives for the proposed anesthesia with the patient or authorized representative who has indicated his/her understanding and acceptance.     Dental advisory given  Plan Discussed with: CRNA and Surgeon  Anesthesia Plan Comments:         Anesthesia Quick Evaluation

## 2022-07-29 ENCOUNTER — Ambulatory Visit (HOSPITAL_BASED_OUTPATIENT_CLINIC_OR_DEPARTMENT_OTHER): Payer: Self-pay | Admitting: Orthopaedic Surgery

## 2022-07-29 DIAGNOSIS — T85848D Pain due to other internal prosthetic devices, implants and grafts, subsequent encounter: Secondary | ICD-10-CM

## 2022-07-29 NOTE — H&P (Signed)
Post Operative Evaluation      Procedure/Date of Surgery: left ankle open reduction internal fixation 03/14/21   Interval History:    Presents today for follow-up status post left ankle open reduction internal fixation.  Overall he is doing well although his hardware is remain symptomatic and he does feel tightness in the area of the syndesmosis.   PMH/PSH/Family History/Social History/Meds/Allergies:   No past medical history on file.      Past Surgical History:  Procedure Laterality Date   HIP SURGERY       LUMBAR DISC SURGERY       OPEN REDUCTION INTERNAL FIXATION (ORIF) METACARPAL Left 03/14/2021    Procedure: OPEN REDUCTION INTERNAL FIXATION (ORIF) LEFT 5TH METACARPAL;  Surgeon: Marlyne Beards, MD;  Location: [facility] OR;  Service: Orthopedics;  Laterality: Left;   ORIF ANKLE FRACTURE Left 03/14/2021    Procedure: OPEN REDUCTION INTERNAL FIXATION (ORIF) ANKLE FRACTURE;  Surgeon: Huel Cote, MD;  Location: [facility] OR;  Service: Orthopedics;  Laterality: Left;   SKIN BIOPSY Bilateral 08/17/2020    pigmented seborrheic keratosis, irritated    Social History         Socioeconomic History   Marital status: Single      Spouse name: Not on file   Number of children: Not on file   Years of education: Not on file   Highest education level: Not on file  Occupational History   Not on file  Tobacco Use   Smoking status: Never   Smokeless tobacco: Never  Vaping Use   Vaping Use: Never used  Substance and Sexual Activity   Alcohol use: Yes      Alcohol/week: 10.0 standard drinks of alcohol      Types: 4 Glasses of wine, 6 Cans of beer per week   Drug use: Never   Sexual activity: Yes      Partners: Female      Birth control/protection: None  Other Topics Concern   Not on file  Social History Narrative    Single, 3 children.  He is the Runner, broadcasting/film/video for [employer].    Social Determinants of Health    Financial Resource Strain: Not on file  Food Insecurity: Not  on file  Transportation Needs: Not on file  Physical Activity: Not on file  Stress: Not on file  Social Connections: Not on file         Family History  Problem Relation Age of Onset   Congestive Heart Failure Father     CAD Father 68    No Known Allergies       Current Outpatient Medications  Medication Sig Dispense Refill   oxycodone (OXY-IR) 5 MG capsule Take 1 capsule (5 mg total) by mouth every 4 (four) hours as needed (severe pain). 10 capsule 0   aspirin EC 325 MG tablet Take 1 tablet (325 mg total) by mouth daily. 30 tablet 0   Cholecalciferol (VITAMIN D3 PO) Take 1 tablet by mouth 4 (four) times a week.       Cyanocobalamin (VITAMIN B-12 PO) Take 1 tablet by mouth 4 (four) times a week.       meloxicam (MOBIC) 15 MG tablet Take 1 tablet (15 mg total) by mouth daily. 20 tablet 0   Multiple Vitamin (MULTIVITAMIN WITH MINERALS) TABS tablet Take 1 tablet by mouth 4 (four) times a week.       Multiple Vitamins-Minerals (AIRBORNE PO) Take 1 tablet by mouth daily as needed (with travels/flying).  Multiple Vitamins-Minerals (PRESERVISION AREDS 2 PO) Take 1 tablet by mouth 4 (four) times a week.       Multiple Vitamins-Minerals (ZINC PO) Take 1 tablet by mouth 4 (four) times a week.       oxycodone (OXY-IR) 5 MG capsule Take 1 capsule (5 mg total) by mouth every 4 (four) hours as needed (severe pain). 20 capsule 0   zolpidem (AMBIEN CR) 12.5 MG CR tablet Take 1 tablet (12.5 mg total) by mouth at bedtime as needed for sleep. 30 tablet 0    No current facility-administered medications for this visit.     Imaging Results (Last 48 hours)  XR Hand Complete Left   Result Date: 10/19/2021 Multiple views of the left hand taken today reviewed interpreted by me.  Demonstrate a healed fifth metacarpal neck fracture with an intramedullary screw.  There is a swan-neck posturing of the ring finger on the x-ray.  There is no evidence of significant degenerative changes present in any joint.       Review of Systems:   A ROS was performed including pertinent positives and negatives as documented in the HPI.     Musculoskeletal Exam:     There were no vitals taken for this visit.   Left ankle incisions are well healed.  Much improved swelling. SILT all distributions. 2+ DP.   Imaging:     X-rays left ankle 3 views: Status post open ankle reduction internal fixation with healed fracture   I personally reviewed and interpreted the radiographs.     Assessment:   62 year old male status post left ankle open reduction internal fixation now symptomatic hardware.  I did discuss risks and benefits associated with removal of hardware.  After discussion he has elected for removal of hardware Plan :     -Plan for left removal of hardware   After a lengthy discussion of treatment options, including risks, benefits, alternatives, complications of surgical and nonsurgical conservative options, the patient elected surgical repair.   The patient  is aware of the material risks  and complications including, but not limited to injury to adjacent structures, neurovascular injury, infection, numbness, bleeding, implant failure, thermal burns, stiffness, persistent pain, failure to heal, disease transmission from allograft, need for further surgery, dislocation, anesthetic risks, blood clots, risks of death,and others. The probabilities of surgical success and failure discussed with patient given their particular co-morbidities.The time and nature of expected rehabilitation and recovery was discussed.The patient's questions were all answered preoperatively.  No barriers to understanding were noted. I explained the natural history of the disease process and Rx rationale.  I explained to the patient what I considered to be reasonable expectations given their personal situation.  The final treatment plan was arrived at through a shared patient decision making process model.

## 2022-07-30 ENCOUNTER — Ambulatory Visit (HOSPITAL_BASED_OUTPATIENT_CLINIC_OR_DEPARTMENT_OTHER): Payer: BC Managed Care – PPO | Admitting: Anesthesiology

## 2022-07-30 ENCOUNTER — Other Ambulatory Visit: Payer: Self-pay

## 2022-07-30 ENCOUNTER — Encounter (HOSPITAL_BASED_OUTPATIENT_CLINIC_OR_DEPARTMENT_OTHER): Payer: Self-pay | Admitting: Orthopaedic Surgery

## 2022-07-30 ENCOUNTER — Ambulatory Visit (HOSPITAL_BASED_OUTPATIENT_CLINIC_OR_DEPARTMENT_OTHER)
Admission: RE | Admit: 2022-07-30 | Discharge: 2022-07-30 | Disposition: A | Discharge status: Home / Self Care | Payer: BC Managed Care – PPO | Attending: Orthopaedic Surgery | Admitting: Orthopaedic Surgery

## 2022-07-30 ENCOUNTER — Encounter (HOSPITAL_BASED_OUTPATIENT_CLINIC_OR_DEPARTMENT_OTHER)
Admission: RE | Disposition: A | Discharge status: Home / Self Care | Payer: Self-pay | Source: Home / Self Care | Attending: Orthopaedic Surgery

## 2022-07-30 ENCOUNTER — Ambulatory Visit (HOSPITAL_BASED_OUTPATIENT_CLINIC_OR_DEPARTMENT_OTHER): Payer: BC Managed Care – PPO

## 2022-07-30 DIAGNOSIS — T85848D Pain due to other internal prosthetic devices, implants and grafts, subsequent encounter: Secondary | ICD-10-CM | POA: Diagnosis not present

## 2022-07-30 DIAGNOSIS — Z01818 Encounter for other preprocedural examination: Secondary | ICD-10-CM

## 2022-07-30 DIAGNOSIS — I10 Essential (primary) hypertension: Secondary | ICD-10-CM | POA: Diagnosis not present

## 2022-07-30 DIAGNOSIS — Z79899 Other long term (current) drug therapy: Secondary | ICD-10-CM | POA: Diagnosis not present

## 2022-07-30 DIAGNOSIS — G629 Polyneuropathy, unspecified: Secondary | ICD-10-CM | POA: Insufficient documentation

## 2022-07-30 DIAGNOSIS — M25572 Pain in left ankle and joints of left foot: Secondary | ICD-10-CM | POA: Diagnosis not present

## 2022-07-30 DIAGNOSIS — T85848A Pain due to other internal prosthetic devices, implants and grafts, initial encounter: Secondary | ICD-10-CM

## 2022-07-30 DIAGNOSIS — Z4589 Encounter for adjustment and management of other implanted devices: Secondary | ICD-10-CM | POA: Diagnosis present

## 2022-07-30 HISTORY — DX: Palpitations: R00.2

## 2022-07-30 HISTORY — PX: HARDWARE REMOVAL: SHX979

## 2022-07-30 HISTORY — DX: Essential (primary) hypertension: I10

## 2022-07-30 HISTORY — DX: Polyneuropathy, unspecified: G62.9

## 2022-07-30 HISTORY — DX: Chronic sinusitis, unspecified: J32.9

## 2022-07-30 SURGERY — REMOVAL, HARDWARE
Anesthesia: General | Site: Ankle | Laterality: Left

## 2022-07-30 MED ORDER — EPHEDRINE 5 MG/ML INJ
INTRAVENOUS | Status: AC
  Filled 2022-07-30: qty 5

## 2022-07-30 MED ORDER — FENTANYL CITRATE (PF) 100 MCG/2ML IJ SOLN
INTRAMUSCULAR | Status: AC
  Filled 2022-07-30: qty 2

## 2022-07-30 MED ORDER — 0.9 % SODIUM CHLORIDE (POUR BTL) OPTIME
TOPICAL | Status: DC | PRN
  Administered 2022-07-30: 60 mL

## 2022-07-30 MED ORDER — KETOROLAC TROMETHAMINE 30 MG/ML IJ SOLN
INTRAMUSCULAR | Status: AC
  Filled 2022-07-30: qty 1

## 2022-07-30 MED ORDER — MIDAZOLAM HCL 2 MG/2ML IJ SOLN
INTRAMUSCULAR | Status: AC
  Filled 2022-07-30: qty 2

## 2022-07-30 MED ORDER — OXYCODONE HCL 5 MG/5ML PO SOLN: 5.0000 mg | Freq: Once | ORAL | Status: AC | PRN

## 2022-07-30 MED ORDER — TRANEXAMIC ACID-NACL 1000-0.7 MG/100ML-% IV SOLN
INTRAVENOUS | Status: AC
  Filled 2022-07-30: qty 100

## 2022-07-30 MED ORDER — BUPIVACAINE HCL (PF) 0.25 % IJ SOLN
INTRAMUSCULAR | Status: AC
  Filled 2022-07-30: qty 30

## 2022-07-30 MED ORDER — KETOROLAC TROMETHAMINE 30 MG/ML IJ SOLN
30.0000 mg | Freq: Once | INTRAMUSCULAR | Status: AC | PRN
  Administered 2022-07-30: 30 mg via INTRAVENOUS

## 2022-07-30 MED ORDER — GABAPENTIN 300 MG PO CAPS
ORAL_CAPSULE | ORAL | Status: AC
  Filled 2022-07-30: qty 1

## 2022-07-30 MED ORDER — FENTANYL CITRATE (PF) 100 MCG/2ML IJ SOLN
100.0000 ug | Freq: Once | INTRAMUSCULAR | Status: AC
  Administered 2022-07-30: 100 ug via INTRAVENOUS

## 2022-07-30 MED ORDER — OXYCODONE HCL 5 MG PO TABS
5.0000 mg | ORAL_TABLET | Freq: Once | ORAL | Status: AC | PRN
  Administered 2022-07-30: 5 mg via ORAL

## 2022-07-30 MED ORDER — PROPOFOL 10 MG/ML IV BOLUS
INTRAVENOUS | Status: DC | PRN
  Administered 2022-07-30: 200 mg via INTRAVENOUS

## 2022-07-30 MED ORDER — IBUPROFEN 800 MG PO TABS: 800.0000 mg | ORAL_TABLET | Freq: Three times a day (TID) | ORAL | 0 refills | Status: AC

## 2022-07-30 MED ORDER — ACETAMINOPHEN 500 MG PO TABS
ORAL_TABLET | ORAL | Status: AC
  Filled 2022-07-30: qty 2

## 2022-07-30 MED ORDER — EPHEDRINE SULFATE (PRESSORS) 50 MG/ML IJ SOLN
INTRAMUSCULAR | Status: DC | PRN
  Administered 2022-07-30: 10 mg via INTRAVENOUS
  Administered 2022-07-30: 15 mg via INTRAVENOUS

## 2022-07-30 MED ORDER — FENTANYL CITRATE (PF) 100 MCG/2ML IJ SOLN
25.0000 ug | INTRAMUSCULAR | Status: DC | PRN
  Administered 2022-07-30 (×2): 50 ug via INTRAVENOUS

## 2022-07-30 MED ORDER — ONDANSETRON HCL 4 MG/2ML IJ SOLN
INTRAMUSCULAR | Status: AC
  Filled 2022-07-30: qty 2

## 2022-07-30 MED ORDER — PROPOFOL 10 MG/ML IV BOLUS
INTRAVENOUS | Status: AC
  Filled 2022-07-30: qty 20

## 2022-07-30 MED ORDER — LACTATED RINGERS IV SOLN: INTRAVENOUS | Status: DC

## 2022-07-30 MED ORDER — BUPIVACAINE HCL (PF) 0.5 % IJ SOLN
INTRAMUSCULAR | Status: DC | PRN
  Administered 2022-07-30: 25 mL via PERINEURAL

## 2022-07-30 MED ORDER — GABAPENTIN 300 MG PO CAPS
300.0000 mg | ORAL_CAPSULE | Freq: Once | ORAL | Status: AC
  Administered 2022-07-30: 300 mg via ORAL

## 2022-07-30 MED ORDER — DEXAMETHASONE SODIUM PHOSPHATE 10 MG/ML IJ SOLN
INTRAMUSCULAR | Status: AC
  Filled 2022-07-30: qty 1

## 2022-07-30 MED ORDER — ONDANSETRON HCL 4 MG/2ML IJ SOLN: 4.0000 mg | Freq: Once | INTRAMUSCULAR | Status: DC | PRN

## 2022-07-30 MED ORDER — TRANEXAMIC ACID-NACL 1000-0.7 MG/100ML-% IV SOLN
1000.0000 mg | INTRAVENOUS | Status: AC
  Administered 2022-07-30: 1000 mg via INTRAVENOUS

## 2022-07-30 MED ORDER — DEXAMETHASONE SODIUM PHOSPHATE 4 MG/ML IJ SOLN
INTRAMUSCULAR | Status: DC | PRN
  Administered 2022-07-30: 5 mg via INTRAVENOUS

## 2022-07-30 MED ORDER — ACETAMINOPHEN 500 MG PO TABS: 500.0000 mg | ORAL_TABLET | Freq: Three times a day (TID) | ORAL | 0 refills | Status: AC

## 2022-07-30 MED ORDER — MIDAZOLAM HCL 2 MG/2ML IJ SOLN
2.0000 mg | Freq: Once | INTRAMUSCULAR | Status: AC
  Administered 2022-07-30: 2 mg via INTRAVENOUS

## 2022-07-30 MED ORDER — VANCOMYCIN HCL 1000 MG IV SOLR
INTRAVENOUS | Status: AC
  Filled 2022-07-30: qty 40

## 2022-07-30 MED ORDER — ONDANSETRON HCL 4 MG/2ML IJ SOLN
INTRAMUSCULAR | Status: DC | PRN
  Administered 2022-07-30: 4 mg via INTRAVENOUS

## 2022-07-30 MED ORDER — LIDOCAINE 2% (20 MG/ML) 5 ML SYRINGE
INTRAMUSCULAR | Status: AC
  Filled 2022-07-30: qty 5

## 2022-07-30 MED ORDER — LIDOCAINE-EPINEPHRINE (PF) 1.5 %-1:200000 IJ SOLN
INTRAMUSCULAR | Status: DC | PRN
  Administered 2022-07-30: 15 mL via PERINEURAL

## 2022-07-30 MED ORDER — OXYCODONE HCL 5 MG PO TABS
ORAL_TABLET | ORAL | Status: AC
  Filled 2022-07-30: qty 1

## 2022-07-30 MED ORDER — OXYCODONE HCL 5 MG PO TABS: 5.0000 mg | ORAL_TABLET | ORAL | 0 refills | Status: DC | PRN

## 2022-07-30 MED ORDER — CEFAZOLIN SODIUM-DEXTROSE 2-4 GM/100ML-% IV SOLN
INTRAVENOUS | Status: AC
  Filled 2022-07-30: qty 100

## 2022-07-30 MED ORDER — ACETAMINOPHEN 500 MG PO TABS
1000.0000 mg | ORAL_TABLET | Freq: Once | ORAL | Status: AC
  Administered 2022-07-30: 1000 mg via ORAL

## 2022-07-30 MED ORDER — LIDOCAINE HCL (CARDIAC) PF 100 MG/5ML IV SOSY
PREFILLED_SYRINGE | INTRAVENOUS | Status: DC | PRN
  Administered 2022-07-30: 50 mg via INTRAVENOUS

## 2022-07-30 MED ORDER — CEFAZOLIN SODIUM-DEXTROSE 2-4 GM/100ML-% IV SOLN
2.0000 g | INTRAVENOUS | Status: AC
  Administered 2022-07-30: 2 g via INTRAVENOUS

## 2022-07-30 SURGICAL SUPPLY — 46 items
APL PRP STRL LF DISP 70% ISPRP (MISCELLANEOUS) ×1
APL SKNCLS STERI-STRIP NONHPOA (GAUZE/BANDAGES/DRESSINGS) ×1
BENZOIN TINCTURE PRP APPL 2/3 (GAUZE/BANDAGES/DRESSINGS) IMPLANT
BLADE SURG 15 STRL LF DISP TIS (BLADE) ×1 IMPLANT
BLADE SURG 15 STRL SS (BLADE) ×2
CHLORAPREP W/TINT 26 (MISCELLANEOUS) ×1 IMPLANT
CUFF TOURN SGL QUICK 34 NS (TOURNIQUET CUFF) IMPLANT
DRAPE IMP U-DRAPE 54X76 (DRAPES) ×1 IMPLANT
DRAPE INCISE IOBAN 66X45 STRL (DRAPES) ×1 IMPLANT
DRAPE U-SHAPE 47X51 STRL (DRAPES) ×2 IMPLANT
DRSG TEGADERM 4X4.75 (GAUZE/BANDAGES/DRESSINGS) IMPLANT
ELECT REM PT RETURN 9FT ADLT (ELECTROSURGICAL) ×1
ELECTRODE REM PT RTRN 9FT ADLT (ELECTROSURGICAL) ×1 IMPLANT
GAUZE PAD ABD 8X10 STRL (GAUZE/BANDAGES/DRESSINGS) ×1 IMPLANT
GAUZE SPONGE 4X4 12PLY STRL (GAUZE/BANDAGES/DRESSINGS) ×1 IMPLANT
GAUZE XEROFORM 1X8 LF (GAUZE/BANDAGES/DRESSINGS) ×1 IMPLANT
GLOVE BIO SURGEON STRL SZ 6 (GLOVE) ×2 IMPLANT
GLOVE BIO SURGEON STRL SZ7.5 (GLOVE) ×1 IMPLANT
GLOVE BIOGEL PI IND STRL 6.5 (GLOVE) ×1 IMPLANT
GLOVE BIOGEL PI IND STRL 8 (GLOVE) ×1 IMPLANT
GLOVE ECLIPSE 8.0 STRL XLNG CF (GLOVE) ×1 IMPLANT
GLOVE SURG SYN 7.5 E (GLOVE) IMPLANT
GLOVE SURG SYN 7.5 PF PI (GLOVE) ×1 IMPLANT
GOWN STRL REUS W/ TWL LRG LVL3 (GOWN DISPOSABLE) ×2 IMPLANT
GOWN STRL REUS W/TWL LRG LVL3 (GOWN DISPOSABLE) ×2
GOWN STRL REUS W/TWL XL LVL3 (GOWN DISPOSABLE) ×1 IMPLANT
PACK ARTHROSCOPY DSU (CUSTOM PROCEDURE TRAY) ×1 IMPLANT
PACK BASIN DAY SURGERY FS (CUSTOM PROCEDURE TRAY) ×1 IMPLANT
PENCIL SMOKE EVACUATOR (MISCELLANEOUS) ×1 IMPLANT
SHEET MEDIUM DRAPE 40X70 STRL (DRAPES) IMPLANT
SLEEVE SCD COMPRESS KNEE MED (STOCKING) ×1 IMPLANT
SPONGE T-LAP 4X18 ~~LOC~~+RFID (SPONGE) ×1 IMPLANT
SUCTION TUBE FRAZIER 10FR DISP (MISCELLANEOUS) ×1 IMPLANT
SUT ETHILON 3 0 PS 1 (SUTURE) ×1 IMPLANT
SUT FIBERWIRE #2 38 T-5 BLUE (SUTURE)
SUT MNCRL AB 3-0 PS2 27 (SUTURE) IMPLANT
SUT PDS AB 1 CT 36 (SUTURE) IMPLANT
SUT VIC AB 0 CT1 27 (SUTURE)
SUT VIC AB 0 CT1 27XCR 8 STRN (SUTURE) IMPLANT
SUT VIC AB 2-0 SH 27 (SUTURE) ×3
SUT VIC AB 2-0 SH 27XBRD (SUTURE) IMPLANT
SUTURE FIBERWR #2 38 T-5 BLUE (SUTURE) IMPLANT
SYR BULB EAR ULCER 3OZ GRN STR (SYRINGE) ×1 IMPLANT
TOWEL GREEN STERILE FF (TOWEL DISPOSABLE) ×2 IMPLANT
TUBE CONNECTING 20X1/4 (TUBING) ×1 IMPLANT
YANKAUER SUCT BULB TIP NO VENT (SUCTIONS) ×1 IMPLANT

## 2022-07-30 NOTE — Anesthesia Procedure Notes (Addendum)
Anesthesia Regional Block: Adductor canal block   Pre-Anesthetic Checklist: , timeout performed,  Correct Patient, Correct Site, Correct Laterality,  Correct Procedure, Correct Position, site marked,  Risks and benefits discussed,  Surgical consent,  Pre-op evaluation,  At surgeon's request and post-op pain management  Laterality: Left  Prep: chloraprep       Needles:  Injection technique: Single-shot  Needle Type: Echogenic Needle     Needle Length: 9cm      Additional Needles:   Procedures:,,,, ultrasound used (permanent image in chart),,    Narrative:  Start time: 07/30/2022 7:10 AM End time: 07/30/2022 7:14 AM Injection made incrementally with aspirations every 5 mL.  Performed by: Personally  Anesthesiologist: Eilene Ghazi, MD  Additional Notes: Patient tolerated the procedure well without complications

## 2022-07-30 NOTE — Anesthesia Postprocedure Evaluation (Signed)
Anesthesia Post Note  Patient: Nicholas Bright  Procedure(s) Performed: LEFT ANKLE HARDWARE REMOVAL (Left: Ankle)     Patient location during evaluation: PACU Anesthesia Type: General Level of consciousness: awake and alert Pain management: pain level controlled Vital Signs Assessment: post-procedure vital signs reviewed and stable Respiratory status: spontaneous breathing, nonlabored ventilation, respiratory function stable and patient connected to nasal cannula oxygen Cardiovascular status: blood pressure returned to baseline and stable Postop Assessment: no apparent nausea or vomiting Anesthetic complications: no  No notable events documented.  Last Vitals:  Vitals:   07/30/22 0845 07/30/22 0900  BP: 118/65 120/61  Pulse: 85 79  Resp: 10 11  Temp:    SpO2: 93% 99%    Last Pain:  Vitals:   07/30/22 0900  TempSrc:   PainSc: 6                  Macoy Rodwell S

## 2022-07-30 NOTE — Progress Notes (Signed)
Assisted Dr. Rose with left, adductor canal, popliteal, ultrasound guided block. Side rails up, monitors on throughout procedure. See vital signs in flow sheet. Tolerated Procedure well. ?

## 2022-07-30 NOTE — Discharge Instructions (Addendum)
Discharge Instructions    Attending Surgeon: Huel Cote, MD Office Phone Number: [phone]   Diagnosis and Procedures:    Surgeries Performed: Left ankle removal of hardware  Discharge Plan:    Diet: Resume usual diet. Begin with light or bland foods.  Drink plenty of fluids.  Activity:  Left ankle activity as tolerated. You may use your boot/crtuches for comfort. You are advised to go home directly from the hospital or surgical center. Restrict your activities.  GENERAL INSTRUCTIONS: 1.  Please apply ice to your wound to help with swelling and inflammation. This will improve your comfort and your overall recovery following surgery.     2. Please call Dr. Serena Croissant office at [phone] with questions Monday-Friday during business hours. If no one answers, please leave a message and someone should get back to the patient within 24 hours. For emergencies please call 911 or proceed to the emergency room.   3. Patient to notify surgical team if experiences any of the following: Bowel/Bladder dysfunction, uncontrolled pain, nerve/muscle weakness, incision with increased drainage or redness, nausea/vomiting and Fever greater than 101.0 F.  Be alert for signs of infection including redness, streaking, odor, fever or chills. Be alert for excessive pain or bleeding and notify your surgeon immediately.  WOUND INSTRUCTIONS:   Leave your dressing, cast, or splint in place until your post operative visit.  Keep it clean and dry.  Always keep the incision clean and dry until the staples/sutures are removed. If there is no drainage from the incision you should keep it open to air. If there is drainage from the incision you must keep it covered at all times until the drainage stops  Do not soak in a bath tub, hot tub, pool, lake or other body of water until 21 days after your surgery and your incision is completely dry and healed.  If you have removable sutures (or staples) they  must be removed 10-14 days (unless otherwise instructed) from the day of your surgery.     1)  Elevate the extremity as much as possible.  2)  Keep the dressing clean and dry.  3)  Please call us if the dressing becomes wet or dirty.  4)  If you are experiencing worsening pain or worsening swelling, please call.     MEDICATIONS: Resume all previous home medications at the previous prescribed dose and frequency unless otherwise noted Start taking the  pain medications on an as-needed basis as prescribed  Please taper down pain medication over the next week following surgery.  Ideally you should not require a refill of any narcotic pain medication.  Take pain medication with food to minimize nausea. In addition to the prescribed pain medication, you may take over-the-counter pain relievers such as Tylenol.  Do NOT take additional tylenol if your pain medication already has tylenol in it.  Aspirin 325mg  daily for four weeks.      FOLLOWUP INSTRUCTIONS: 1. Follow up at the Physical Therapy Clinic 3-4 days following surgery. This appointment should be scheduled unless other arrangements have been made.The Physical Therapy scheduling number is [phone] if an appointment has not already been arranged.  2. Contact Dr. Serena Croissant office during office hours at [phone] or the practice after hours line at [phone] for non-emergencies. For medical emergencies call 911.   Discharge Location: Home    Post Anesthesia Home Care Instructions  Activity: Get plenty of rest for the remainder of the day. A responsible individual must stay  with you for 24 hours following the procedure.  For the next 24 hours, DO NOT: -Drive a car -Advertising copywriter -Drink alcoholic beverages -Take any medication unless instructed by your physician -Make any legal decisions or sign important papers.  Meals: Start with liquid foods such as gelatin or soup. Progress to regular foods as tolerated. Avoid  greasy, spicy, heavy foods. If nausea and/or vomiting occur, drink only clear liquids until the nausea and/or vomiting subsides. Call your physician if vomiting continues.  Special Instructions/Symptoms: Your throat may feel dry or sore from the anesthesia or the breathing tube placed in your throat during surgery. If this causes discomfort, gargle with warm salt water. The discomfort should disappear within 24 hours.  If you had a scopolamine patch placed behind your ear for the management of post- operative nausea and/or vomiting:  1. The medication in the patch is effective for 72 hours, after which it should be removed.  Wrap patch in a tissue and discard in the trash. Wash hands thoroughly with soap and water. 2. You may remove the patch earlier than 72 hours if you experience unpleasant side effects which may include dry mouth, dizziness or visual disturbances. 3. Avoid touching the patch. Wash your hands with soap and water after contact with the patch.  Regional Anesthesia Blocks  1. Numbness or the inability to move the "blocked" extremity may last from 3-48 hours after placement. The length of time depends on the medication injected and your individual response to the medication. If the numbness is not going away after 48 hours, call your surgeon.  2. The extremity that is blocked will need to be protected until the numbness is gone and the  Strength has returned. Because you cannot feel it, you will need to take extra care to avoid injury. Because it may be weak, you may have difficulty moving it or using it. You may not know what position it is in without looking at it while the block is in effect.  3. For blocks in the legs and feet, returning to weight bearing and walking needs to be done carefully. You will need to wait until the numbness is entirely gone and the strength has returned. You should be able to move your leg and foot normally before you try and bear weight or walk. You  will need someone to be with you when you first try to ensure you do not fall and possibly risk injury.  4. Bruising and tenderness at the needle site are common side effects and will resolve in a few days.  5. Persistent numbness or new problems with movement should be communicated to the surgeon or the [facility] [phone] [facility] [phone]).  No tylenol until after 12:30pm today if needed

## 2022-07-30 NOTE — Interval H&P Note (Signed)
History and Physical Interval Note:  07/30/2022 6:59 AM  Nicholas Bright  has presented today for surgery, with the diagnosis of LEFT ANKLE PAINFUL HARDWARE.  The various methods of treatment have been discussed with the patient and family. After consideration of risks, benefits and other options for treatment, the patient has consented to  Procedure(s): LEFT ANKLE HARDWARE REMOVAL (Left) as a surgical intervention.  The patient's history has been reviewed, patient examined, no change in status, stable for surgery.  I have reviewed the patient's chart and labs.  Questions were answered to the patient's satisfaction.     Huel Cote

## 2022-07-30 NOTE — Anesthesia Procedure Notes (Signed)
Procedure Name: LMA Insertion Date/Time: 07/30/2022 7:30 AM  Performed by: Cleda Clarks, CRNAPre-anesthesia Checklist: Patient identified, Emergency Drugs available, Suction available and Patient being monitored Patient Re-evaluated:Patient Re-evaluated prior to induction Oxygen Delivery Method: Circle system utilized Preoxygenation: Pre-oxygenation with 100% oxygen Induction Type: IV induction Ventilation: Mask ventilation without difficulty LMA: LMA inserted LMA Size: 5.0 Number of attempts: 1 Placement Confirmation: positive ETCO2 Tube secured with: Tape Dental Injury: Teeth and Oropharynx as per pre-operative assessment

## 2022-07-30 NOTE — Anesthesia Procedure Notes (Addendum)
Anesthesia Regional Block: Popliteal block   Pre-Anesthetic Checklist: , timeout performed,  Correct Patient, Correct Site, Correct Laterality,  Correct Procedure, Correct Position, site marked,  Risks and benefits discussed,  Surgical consent,  Pre-op evaluation,  At surgeon's request and post-op pain management  Laterality: Left  Prep: chloraprep       Needles:  Injection technique: Single-shot  Needle Type: Echogenic Needle     Needle Length: 9cm      Additional Needles:   Procedures:,,,, ultrasound used (permanent image in chart),,    Narrative:  Start time: 07/30/2022 6:58 AM End time: 07/30/2022 7:08 AM Injection made incrementally with aspirations every 5 mL.  Performed by: Personally  Anesthesiologist: Eilene Ghazi, MD  Additional Notes: Patient tolerated the procedure well without complications

## 2022-07-30 NOTE — Transfer of Care (Signed)
Immediate Anesthesia Transfer of Care Note  Patient: Nicholas Bright  Procedure(s) Performed: LEFT ANKLE HARDWARE REMOVAL (Left: Ankle)  Patient Location: PACU  Anesthesia Type:GA combined with regional for post-op pain  Level of Consciousness: awake, alert , and oriented  Airway & Oxygen Therapy: Patient Spontanous Breathing and Patient connected to face mask oxygen  Post-op Assessment: Report given to RN and Post -op Vital signs reviewed and stable  Post vital signs: Reviewed and stable  Last Vitals:  Vitals Value Taken Time  BP 114/72   Temp    Pulse 87 07/30/22 0828  Resp 12 07/30/22 0828  SpO2 98 % 07/30/22 0828  Vitals shown include unvalidated device data.  Last Pain:  Vitals:   07/30/22 0633  TempSrc: Oral  PainSc: 0-No pain      Patients Stated Pain Goal: 3 (07/30/22 1610)  Complications: No notable events documented.

## 2022-07-30 NOTE — Op Note (Signed)
   Date of Surgery: 07/30/2022  INDICATIONS: Nicholas Bright is a 62 y.o.-year-old male with left ankle symptomatic hardware.  The risk and benefits of the procedure were discussed in detail and documented in the pre-operative evaluation.   PREOPERATIVE DIAGNOSIS: 1.  Left ankle symptomatic hardware  POSTOPERATIVE DIAGNOSIS: Same.  PROCEDURE: 1.  Left ankle removal of hardware deep  SURGEON: Benancio Deeds MD  ASSISTANT: Kerby Less, ATC  ANESTHESIA:  general plus peripheral nerve block  IV FLUIDS AND URINE: See anesthesia record.  ANTIBIOTICS: Ancef  ESTIMATED BLOOD LOSS: 10 mL.  IMPLANTS:  * No implants in log *  DRAINS: None  CULTURES: None  COMPLICATIONS: none  DESCRIPTION OF PROCEDURE:   The patient was identified in the preoperative holding area.  The correct site was marked coronary risk protocol.  He subsequently taken back to the operating room.  Antibiotics were given 1 hour prior to skin incision.  He is prepped and draped in the usual sterile fashion with all bony prominences padded.  Final timeout was performed.  15 blade was used to incise through skin over the lateral malleolus.  Plate was identified and electrocautery was used to remove tissue.  Elevator was used to then elevate of the tissue around the plate.  Screws were removed.  The plate was removed.  There was subsequent bony union that was robust.  15 blade was used to cut the sutures over the tight rope implant.  A counterincision was made over the medial malleolus over the tight rope implant.  This was identified and this was pulled out.  Wounds were thoroughly irrigated.  Final x-rays were taken confirming no residual hardware.  All counts were correct at the end of the case.  The wound was closed in layers of 2-0 Vicryl and 3-0 Monocryl.  Dressing was applied with Steri-Strips gauze Tegaderm and an Ace wrap.    POSTOPERATIVE PLAN: He will be weightbearing as tolerated.  I will see him back in 2 weeks for  wound check.  Benancio Deeds, MD 8:33 AM

## 2022-07-30 NOTE — Brief Op Note (Signed)
   Brief Op Note  Date of Surgery: 07/30/2022  Preoperative Diagnosis: LEFT ANKLE PAINFUL HARDWARE  Postoperative Diagnosis: same  Procedure: Procedure(s): LEFT ANKLE HARDWARE REMOVAL  Implants: * No implants in log *  Surgeons: Surgeon(s): Huel Cote, MD  Anesthesia: General    Estimated Blood Loss: See anesthesia record  Complications: None  Condition to PACU: Stable  Benancio Deeds, MD 07/30/2022 8:28 AM

## 2022-07-31 ENCOUNTER — Encounter (HOSPITAL_BASED_OUTPATIENT_CLINIC_OR_DEPARTMENT_OTHER): Payer: Self-pay | Admitting: Orthopaedic Surgery

## 2022-08-02 NOTE — Anesthesia Procedure Notes (Signed)
Anesthesia Procedure Image    

## 2022-08-02 NOTE — Addendum Note (Signed)
Addendum  created 08/02/22 0825 by Eilene Ghazi, MD   Child order released for a procedure order, Clinical Note Signed, Intraprocedure Blocks edited, SmartForm saved

## 2022-08-14 ENCOUNTER — Ambulatory Visit (INDEPENDENT_AMBULATORY_CARE_PROVIDER_SITE_OTHER): Payer: BC Managed Care – PPO | Admitting: Orthopaedic Surgery

## 2022-08-14 DIAGNOSIS — T85848D Pain due to other internal prosthetic devices, implants and grafts, subsequent encounter: Secondary | ICD-10-CM

## 2022-08-14 DIAGNOSIS — M25572 Pain in left ankle and joints of left foot: Secondary | ICD-10-CM

## 2022-08-14 NOTE — Progress Notes (Signed)
Post Operative Evaluation    Procedure/Date of Surgery: Left ankle removal of hardware 6/4  Interval History:   Presents today 2 weeks status post the above procedure.  Overall his ankle feels much less painful in terms of the medial tight rope site.  Swelling is much improved.  He does have some tightness about the joint itself but this is mild.   PMH/PSH/Family History/Social History/Meds/Allergies:    Past Medical History:  Diagnosis Date   Hypertension    Neuropathy    Palpitations    Sinusitis    Past Surgical History:  Procedure Laterality Date   HARDWARE REMOVAL Left 07/30/2022   Procedure: LEFT ANKLE HARDWARE REMOVAL;  Surgeon: Huel Cote, MD;  Location: [facility];  Service: Orthopedics;  Laterality: Left;   HIP SURGERY     LUMBAR DISC SURGERY     OPEN REDUCTION INTERNAL FIXATION (ORIF) METACARPAL Left 03/14/2021   Procedure: OPEN REDUCTION INTERNAL FIXATION (ORIF) LEFT 5TH METACARPAL;  Surgeon: Marlyne Beards, MD;  Location: [facility];  Service: Orthopedics;  Laterality: Left;   ORIF ANKLE FRACTURE Left 03/14/2021   Procedure: OPEN REDUCTION INTERNAL FIXATION (ORIF) ANKLE FRACTURE;  Surgeon: Huel Cote, MD;  Location: [facility];  Service: Orthopedics;  Laterality: Left;   SKIN BIOPSY Bilateral 08/17/2020   pigmented seborrheic keratosis, irritated   Social History   Socioeconomic History   Marital status: Single    Spouse name: Not on file   Number of children: Not on file   Years of education: Not on file   Highest education level: Not on file  Occupational History   Not on file  Tobacco Use   Smoking status: Never   Smokeless tobacco: Never  Vaping Use   Vaping Use: Never used  Substance and Sexual Activity   Alcohol use: Yes    Alcohol/week: 10.0 standard drinks of alcohol    Types: 4 Glasses of wine, 6 Cans of beer per week    Comment: moderately   Drug use: Never   Sexual activity: Yes     Partners: Female    Birth control/protection: None  Other Topics Concern   Not on file  Social History Narrative   Single, 3 children.  He is the Runner, broadcasting/film/video for [employer].      Right handed   Lives in a two story home    Social Determinants of Health   Financial Resource Strain: Not on file  Food Insecurity: Not on file  Transportation Needs: Not on file  Physical Activity: Not on file  Stress: Not on file  Social Connections: Not on file   Family History  Problem Relation Age of Onset   Heart Problems Mother        Heart Stent   Congestive Heart Failure Father    CAD Father 23   Stroke Maternal Grandfather    Heart attack Paternal Grandfather    No Known Allergies Current Outpatient Medications  Medication Sig Dispense Refill   azithromycin (ZITHROMAX) 250 MG tablet Take 2 tablets on day 1, then 1 tablet the next 2 days 4 each 0   Cholecalciferol (VITAMIN D3 PO) Take 1 tablet by mouth 4 (four) times a week. (Patient not taking: Reported on 03/25/2022)     Cyanocobalamin (VITAMIN B-12 PO) Take 1 tablet by mouth 4 (four) times  a week. (Patient not taking: Reported on 03/25/2022)     Multiple Vitamin (MULTIVITAMIN WITH MINERALS) TABS tablet Take 1 tablet by mouth 4 (four) times a week.     Multiple Vitamins-Minerals (AIRBORNE PO) Take 1 tablet by mouth daily as needed (with travels/flying). (Patient not taking: Reported on 03/25/2022)     Multiple Vitamins-Minerals (PRESERVISION AREDS 2 PO) Take 1 tablet by mouth 4 (four) times a week. (Patient not taking: Reported on 04/09/2022)     Multiple Vitamins-Minerals (ZINC PO) Take 1 tablet by mouth 4 (four) times a week. (Patient not taking: Reported on 03/25/2022)     oxyCODONE (ROXICODONE) 5 MG immediate release tablet Take 1 tablet (5 mg total) by mouth every 4 (four) hours as needed for severe pain or breakthrough pain. 5 tablet 0   No current facility-administered medications for this visit.   No results found.  Review of  Systems:   A ROS was performed including pertinent positives and negatives as documented in the HPI.   Musculoskeletal Exam:    There were no vitals taken for this visit.  Left ankle incisions are well-appearing without erythema or drainage.  Full range of motion about the ankle with some tightness.  Sensation is intact throughout  Imaging:      I personally reviewed and interpreted the radiographs.   Assessment:   2 weeks status post left ankle removal of hardware overall doing extremely well.  This time may be activity as tolerated.  I will plan to see him back as needed  Plan :    -Return to clinic as needed      I personally saw and evaluated the patient, and participated in the management and treatment plan.  Huel Cote, MD Attending Physician, Orthopedic Surgery  This document was dictated using Dragon voice recognition software. A reasonable attempt at proof reading has been made to minimize errors.

## 2023-01-19 ENCOUNTER — Encounter: Payer: Self-pay | Admitting: Sports Medicine

## 2023-01-19 MED ORDER — METHYLPREDNISOLONE 4 MG PO TBPK: ORAL_TABLET | ORAL | 0 refills | Status: DC

## 2023-01-21 ENCOUNTER — Other Ambulatory Visit: Payer: Self-pay | Admitting: Sports Medicine

## 2023-01-21 MED ORDER — TRAMADOL HCL 50 MG PO TABS: 50.0000 mg | ORAL_TABLET | Freq: Four times a day (QID) | ORAL | 0 refills | Status: DC | PRN

## 2023-01-21 NOTE — Progress Notes (Signed)
Nicholas Bright is a patient of mine who unfortunately had an exacerbation of his low back pain when [place]  Did provide a prescription for 6-day Medrol Dosepak which has been helpful.  He is planning on returning back home for further treatment but is hoping for some pain relief for his breakthrough pain in the interim.  Will send him a short supply of tramadol, 15 tablets to take for breakthrough pain only. Once he gets back to [place], he can seek evaluation and treatment at that time.  Madelyn Brunner, DO Primary Care Sports Medicine Physician  [facility] - Orthopedics  This note was dictated using Dragon naturally speaking software and may contain errors in syntax, spelling, or content which have not been identified prior to signing this note.

## 2023-02-03 ENCOUNTER — Other Ambulatory Visit (HOSPITAL_BASED_OUTPATIENT_CLINIC_OR_DEPARTMENT_OTHER): Payer: Self-pay

## 2023-02-03 DIAGNOSIS — G5793 Unspecified mononeuropathy of bilateral lower limbs: Secondary | ICD-10-CM

## 2023-02-06 ENCOUNTER — Other Ambulatory Visit (HOSPITAL_BASED_OUTPATIENT_CLINIC_OR_DEPARTMENT_OTHER): Payer: Self-pay | Admitting: Orthopaedic Surgery

## 2023-02-06 MED ORDER — LORAZEPAM 1 MG PO TABS: 1.0000 mg | ORAL_TABLET | Freq: Three times a day (TID) | ORAL | 0 refills | Status: DC

## 2023-02-10 ENCOUNTER — Encounter (HOSPITAL_BASED_OUTPATIENT_CLINIC_OR_DEPARTMENT_OTHER): Payer: Self-pay | Admitting: Orthopaedic Surgery

## 2023-02-13 ENCOUNTER — Ambulatory Visit
Admission: RE | Admit: 2023-02-13 | Discharge: 2023-02-13 | Disposition: A | Discharge status: Home / Self Care | Payer: BC Managed Care – PPO | Source: Ambulatory Visit | Attending: Orthopaedic Surgery | Admitting: Orthopaedic Surgery

## 2023-02-13 DIAGNOSIS — G5793 Unspecified mononeuropathy of bilateral lower limbs: Secondary | ICD-10-CM

## 2023-02-14 ENCOUNTER — Other Ambulatory Visit (HOSPITAL_BASED_OUTPATIENT_CLINIC_OR_DEPARTMENT_OTHER): Payer: Self-pay | Admitting: Orthopaedic Surgery

## 2023-02-14 DIAGNOSIS — G5793 Unspecified mononeuropathy of bilateral lower limbs: Secondary | ICD-10-CM

## 2023-02-15 ENCOUNTER — Other Ambulatory Visit (HOSPITAL_BASED_OUTPATIENT_CLINIC_OR_DEPARTMENT_OTHER): Payer: Self-pay | Admitting: Orthopaedic Surgery

## 2023-02-15 MED ORDER — METHYLPREDNISOLONE 4 MG PO TBPK: ORAL_TABLET | ORAL | 0 refills | Status: DC

## 2023-02-16 ENCOUNTER — Other Ambulatory Visit (HOSPITAL_BASED_OUTPATIENT_CLINIC_OR_DEPARTMENT_OTHER): Payer: Self-pay | Admitting: Student

## 2023-02-16 DIAGNOSIS — M25552 Pain in left hip: Secondary | ICD-10-CM

## 2023-02-16 MED ORDER — METHYLPREDNISOLONE 4 MG PO TBPK: ORAL_TABLET | ORAL | 0 refills | Status: DC

## 2023-03-03 ENCOUNTER — Encounter: Payer: BC Managed Care – PPO | Admitting: Orthopedic Surgery

## 2023-03-06 ENCOUNTER — Ambulatory Visit (INDEPENDENT_AMBULATORY_CARE_PROVIDER_SITE_OTHER): Payer: 59 | Admitting: Orthopedic Surgery

## 2023-03-06 ENCOUNTER — Other Ambulatory Visit (INDEPENDENT_AMBULATORY_CARE_PROVIDER_SITE_OTHER): Payer: Self-pay

## 2023-03-06 VITALS — BP 149/83 | HR 69 | Ht 75.0 in

## 2023-03-06 DIAGNOSIS — M545 Low back pain, unspecified: Secondary | ICD-10-CM

## 2023-03-06 NOTE — Progress Notes (Addendum)
 Orthopedic Spine Surgery Office Note  Assessment: Patient is a 63 y.o. male with low back pain that radiates into the left lower extremity.  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  Pain has been improving with conservative treatment   Plan: -Explained that initially conservative treatment is tried as a significant number of patients may experience relief with these treatment modalities. Discussed that the conservative treatments include:  -activity modification  -physical therapy  -over the counter pain medications  -medrol  dosepak  -lumbar steroid injections -Patient has tried tylenol , medrol  dosepak, tramadol    -Patient already had a referral for a lumbar steroid injection which I told him is another good conservative treatment to try -Patient should return to office on as needed basis   Patient expressed understanding of the plan and all questions were answered to the patient's satisfaction.   ___________________________________________________________________________   History:  Patient is a 63 y.o. male who presents today for lumbar spine.  Patient has a history of an L4/5 microdiscectomy many years ago.  Since that time, he had been doing well until 2019 at which point he had another flare of low back pain that radiated into his left lower extremity.  This got better after conservative treatment.  He said more recently, he has developed pain in his low back that radiates in the left lower extremity along the posterolateral aspect of the thigh and leg to the level of the ankle.  He said it started at the end of November 2024 when he went to pick something up.  He said he noticed a pop when he bent over.  He had pain in his low back going into his left leg after that time.  He said since then it has gotten better especially in the last 2 weeks.  He still has some mild pain but it is currently tolerable.  He is set to get an injection with Dr.  Eldonna soon.   Weakness: denies Symptoms of imbalance: denies Paresthesias and numbness: yes, has decreased sensation on the plantar aspect of bilateral feet. No other numbness or paresthesias Bowel or bladder incontinence: denies Saddle anesthesia: denies  Treatments tried: tylenol , medrol  dosepak, tramadol    Review of systems: Denies fevers and chills, night sweats, unexplained weight loss, history of cancer. Has had pain that wakes him at night  Past medical history: HTN  Allergies: NKDA  Past surgical history:  Right THA L4/5 microdiscectomy Left fibula fracture ORIF and subsequent hardware removal Left fifth metacarpal fracture ORIF  Social history: Denies use of nicotine product (smoking, vaping, patches, smokeless) Alcohol use: Yes, approximately 10 drinks per week Denies recreational drug use   Physical Exam:  BMI of 26.7  General: no acute distress, appears stated age Neurologic: alert, answering questions appropriately, following commands Respiratory: unlabored breathing on room air, symmetric chest rise Psychiatric: appropriate affect, normal cadence to speech   MSK (spine):  -Strength exam      Left  Right EHL    5/5  5/5 TA    5/5  5/5 GSC    5/5  5/5 Knee extension  5/5  5/5 Hip flexion   5/5  5/5  -Sensory exam    Sensation intact to light touch in L3-S1 nerve distributions of bilateral lower extremities  -Achilles DTR: 2/4 on the left, 2/4 on the right -Patellar tendon DTR: 2/4 on the left, 2/4 on the right  -Straight leg raise: Negative bilaterally -Clonus: no beats bilaterally  -Left hip exam:  No pain through range of motion, negative Stinchfield, negative FABER -Right hip exam: No pain to range of motion, negative Stinchfield, negative FABER  Imaging: XRs of the lumbar spine from 03/06/2023 were independently reviewed and interpreted, showing disc height loss at L5/S1.  No other significant degenerative changes seen.  No evidence of  instability on flexion/extension views.  No fracture or dislocation seen.  MRI of the lumbar spine from 02/13/2023 was independently reviewed and interpreted, showing DDD at L5/S1 with bilateral foraminal stenosis.  Central disc herniation at L4/5 causing lateral recess stenosis particularly on the left.  No other significant stenosis seen.   Patient name: Nicholas Bright Patient MRN: [id number] Date of visit: 03/06/23

## 2023-03-19 ENCOUNTER — Encounter: Payer: Self-pay | Admitting: Sports Medicine

## 2023-03-19 ENCOUNTER — Ambulatory Visit: Payer: 59 | Admitting: Sports Medicine

## 2023-03-19 VITALS — BP 135/80 | HR 89 | Ht 75.0 in | Wt 210.0 lb

## 2023-03-19 DIAGNOSIS — Z0000 Encounter for general adult medical examination without abnormal findings: Secondary | ICD-10-CM

## 2023-03-19 DIAGNOSIS — E663 Overweight: Secondary | ICD-10-CM

## 2023-03-19 DIAGNOSIS — E7800 Pure hypercholesterolemia, unspecified: Secondary | ICD-10-CM

## 2023-03-19 NOTE — Progress Notes (Signed)
Office Visit Note   Patient: Nicholas Bright           Date of Birth: [birth year]           MRN: [id number] Visit Date: 03/19/2023 Requested by: No referring provider defined for this encounter. PCP: System, Provider Not In  Subjective: Chief Complaint  Patient presents with   Annual Exam   HPI: Nicholas Bright is a pleasant 63 year old male who presents today for annual physical.  He is a current Runner, broadcasting/film/video for [employer].  He is a healthy and active male.  He does not take any prescription medications on a consistent basis.  He denies any personal history of CAD, diabetes or CKD.  He has had elevated blood pressure in the past but has never had a diagnosis of hypertension.  He is followed by cardiology, Dr. Antoine Bright  for previously abnormal RSR prime in V1 and V2.  No intervention was needed and he follows up with him on a yearly basis. He had a coronary calcium score of 13.6 on 11/16/2021 which was low-intermediate for his age.  ROS:  -No chest pain, no dyspnea on exertion -No shortness of breath, wheezing              Health Maintenance: Colonoscopy: Colonoscopy at age 46 which was within normal limits; had a negative Cologuard on 03/16/2022 Immunizations:  Has had 3 COVID vaccinations including booster; he has not had a flu vaccine this year.   - He is interested in getting flu vaccine --> recommend at local pharmacy ([facility], [facility])  Objective: Vital Signs: BP 135/80 (BP Location: Left Arm, Patient Position: Sitting)   Pulse 89   Ht 6\' 3"  (1.905 m)   Wt 210 lb (95.3 kg)   BMI 26.25 kg/m   Physical Exam:  Gen: Well-appearing, in no acute distress; non-toxic HENT: Normocephalic, atraumatic; clear nasal turbinates; no redness or swelling of the posterior oropharynx  CV: Regular rate, normal S1 and S2.  No murmurs gallops or rubs. Well-perfused. Warm.  Resp: Breathing unlabored on room air, full breath sounds in both anterior and posterior lung fields; no wheezing, rhonchi or  rales Psych: Fluid speech in conversation; appropriate affect; normal thought process Neuro: Sensation intact throughout. No gross coordination deficits.  Imaging: N/A  Assessment & Plan: Annual physical exam - well male. No additional issues     *Will obtain CBC, CMP, lipid panel, hemoglobin A1c 2.   Family History of CAD, CHF (father) - reassuring coronary calcium scoring in 2022. Follows with cardiology -I did discuss could consider repeating this at about 5-year interval, appreciate cards recs. We are checking lipid panel today. Continue maintaining normotensive BP's. Discussed increase exercise regimen - plans to do with partner Nicholas Bright) 3.  Need for influenza vaccine - will obtain at pharmacy  Follow-Up Instructions: Return in about 1 year (around 03/18/2024).   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   Past Medical History:  Diagnosis Date  Hypertension    Neuropathy    Palpitations    Sinusitis     Family History  Problem Relation Age of Onset   Heart Problems Mother        Heart Stent   Congestive Heart Failure Father    CAD Father 49   Stroke Maternal Grandfather    Heart attack Paternal Grandfather     Past Surgical History:  Procedure Laterality Date   HARDWARE REMOVAL Left 07/30/2022   Procedure: LEFT ANKLE HARDWARE REMOVAL;  Surgeon: Huel Cote, MD;  Location: [facility];  Service: Orthopedics;  Laterality: Left;   HIP SURGERY     LUMBAR DISC SURGERY     OPEN REDUCTION INTERNAL FIXATION (ORIF) METACARPAL Left 03/14/2021   Procedure: OPEN REDUCTION INTERNAL FIXATION (ORIF) LEFT 5TH METACARPAL;  Surgeon: Marlyne Beards, MD;  Location: [facility];  Service: Orthopedics;  Laterality: Left;   ORIF ANKLE FRACTURE Left 03/14/2021   Procedure: OPEN REDUCTION INTERNAL FIXATION (ORIF) ANKLE FRACTURE;  Surgeon: Huel Cote, MD;  Location: [facility];  Service: Orthopedics;  Laterality: Left;   SKIN BIOPSY Bilateral 08/17/2020   pigmented seborrheic keratosis, irritated   Social History   Occupational History   Not on file  Tobacco Use   Smoking status: Never   Smokeless tobacco: Never  Vaping Use   Vaping status: Never Used  Substance and Sexual Activity   Alcohol use: Yes    Alcohol/week: 10.0 standard drinks of alcohol    Types: 4 Glasses of wine, 6 Cans of beer per week    Comment: moderately   Drug use: Never   Sexual activity: Yes    Partners: Female    Birth control/protection: None

## 2023-03-20 ENCOUNTER — Encounter: Payer: Self-pay | Admitting: Sports Medicine

## 2023-03-20 ENCOUNTER — Ambulatory Visit: Payer: 59 | Admitting: Physical Medicine and Rehabilitation

## 2023-03-20 ENCOUNTER — Other Ambulatory Visit: Payer: Self-pay

## 2023-03-20 DIAGNOSIS — M5416 Radiculopathy, lumbar region: Secondary | ICD-10-CM

## 2023-03-20 LAB — CBC WITH DIFFERENTIAL/PLATELET
Absolute Lymphocytes: 1436 {cells}/uL (ref 850–3900)
Absolute Monocytes: 550 {cells}/uL (ref 200–950)
Basophils Absolute: 28 {cells}/uL (ref 0–200)
Basophils Relative: 0.5 %
Eosinophils Absolute: 143 {cells}/uL (ref 15–500)
Eosinophils Relative: 2.6 %
HCT: 46.6 % (ref 38.5–50.0)
Hemoglobin: 16 g/dL (ref 13.2–17.1)
MCH: 32.8 pg (ref 27.0–33.0)
MCHC: 34.3 g/dL (ref 32.0–36.0)
MCV: 95.5 fL (ref 80.0–100.0)
MPV: 10.5 fL (ref 7.5–12.5)
Monocytes Relative: 10 %
Neutro Abs: 3344 {cells}/uL (ref 1500–7800)
Neutrophils Relative %: 60.8 %
Platelets: 231 10*3/uL (ref 140–400)
RBC: 4.88 10*6/uL (ref 4.20–5.80)
RDW: 12 % (ref 11.0–15.0)
Total Lymphocyte: 26.1 %
WBC: 5.5 10*3/uL (ref 3.8–10.8)

## 2023-03-20 LAB — COMPREHENSIVE METABOLIC PANEL
AG Ratio: 1.6 (calc) (ref 1.0–2.5)
ALT: 28 U/L (ref 9–46)
AST: 35 U/L (ref 10–35)
Albumin: 4.6 g/dL (ref 3.6–5.1)
Alkaline phosphatase (APISO): 54 U/L (ref 35–144)
BUN: 9 mg/dL (ref 7–25)
CO2: 25 mmol/L (ref 20–32)
Calcium: 9.5 mg/dL (ref 8.6–10.3)
Chloride: 104 mmol/L (ref 98–110)
Creat: 0.74 mg/dL (ref 0.70–1.35)
Globulin: 2.8 g/dL (ref 1.9–3.7)
Glucose, Bld: 101 mg/dL — ABNORMAL HIGH (ref 65–99)
Potassium: 4.4 mmol/L (ref 3.5–5.3)
Sodium: 138 mmol/L (ref 135–146)
Total Bilirubin: 0.8 mg/dL (ref 0.2–1.2)
Total Protein: 7.4 g/dL (ref 6.1–8.1)

## 2023-03-20 LAB — LIPID PANEL
Cholesterol: 196 mg/dL (ref <200)
HDL: 66 mg/dL (ref >=40)
LDL Cholesterol (Calc): 107 mg/dL — ABNORMAL HIGH
Non-HDL Cholesterol (Calc): 130 mg/dL — ABNORMAL HIGH (ref <130)
Total CHOL/HDL Ratio: 3 (calc) (ref <5.0)
Triglycerides: 119 mg/dL (ref <150)

## 2023-03-20 LAB — HEMOGLOBIN A1C
Hgb A1c MFr Bld: 5.7 %{Hb} — ABNORMAL HIGH (ref <5.7)
Mean Plasma Glucose: 117 mg/dL
eAG (mmol/L): 6.5 mmol/L

## 2023-03-20 MED ORDER — METHYLPREDNISOLONE ACETATE 40 MG/ML IJ SUSP
40.0000 mg | Freq: Once | INTRAMUSCULAR | Status: AC
  Administered 2023-03-20: 40 mg

## 2023-03-20 NOTE — Progress Notes (Signed)
Functional Pain Scale - descriptive words and definitions  Mild (2)   Noticeable when not distracted/no impact on ADL's/sleep only slightly affected and able to   use both passive and active distraction for comfort. Mild range order  Average Pain 2  L is worse than R.  He is doing a lot better than he was.   +Driver, -BT, -Dye Allergies.

## 2023-03-21 ENCOUNTER — Encounter: Payer: Self-pay | Admitting: Sports Medicine

## 2023-03-27 NOTE — Procedures (Signed)
Lumbosacral Transforaminal Epidural Steroid Injection - Sub-Pedicular Approach with Fluoroscopic Guidance  Patient: Nicholas Bright      Date of Birth: 04/01/1960 MRN: [id number] PCP: System, Provider Not In      Visit Date: 03/20/2023   Universal Protocol:    Date/Time: 03/20/2023  Consent Given By: the patient  Position: PRONE  Additional Comments: Vital signs were monitored before and after the procedure. Patient was prepped and draped in the usual sterile fashion. The correct patient, procedure, and site was verified.   Injection Procedure Details:   Procedure diagnoses: Lumbar radiculopathy [M54.16]    Meds Administered:  Meds ordered this encounter  Medications   methylPREDNISolone acetate (DEPO-MEDROL) injection 40 mg    Laterality: Left  Location/Site: L5  Needle:5.0 in., 22 ga.  Short bevel or Quincke spinal needle  Needle Placement: Transforaminal  Findings:    -Comments: Excellent flow of contrast along the nerve, nerve root and into the epidural space.  Procedure Details: After squaring off the end-plates to get a true AP view, the C-arm was positioned so that an oblique view of the foramen as noted above was visualized. The target area is just inferior to the "nose of the scotty dog" or sub pedicular. The soft tissues overlying this structure were infiltrated with 2-3 ml. of 1% Lidocaine without Epinephrine.  The spinal needle was inserted toward the target using a "trajectory" view along the fluoroscope beam.  Under AP and lateral visualization, the needle was advanced so it did not puncture dura and was located close the 6 O'Clock position of the pedical in AP tracterory. Biplanar projections were used to confirm position. Aspiration was confirmed to be negative for CSF and/or blood. A 1-2 ml. volume of Isovue-250 was injected and flow of contrast was noted at each level. Radiographs were obtained for documentation purposes.   After attaining the desired flow  of contrast documented above, a 0.5 to 1.0 ml test dose of 0.25% Marcaine was injected into each respective transforaminal space.  The patient was observed for 90 seconds post injection.  After no sensory deficits were reported, and normal lower extremity motor function was noted,   the above injectate was administered so that equal amounts of the injectate were placed at each foramen (level) into the transforaminal epidural space.   Additional Comments:  No complications occurred Dressing: 2 x 2 sterile gauze and Band-Aid    Post-procedure details: Patient was observed during the procedure. Post-procedure instructions were reviewed.  Patient left the clinic in stable condition.

## 2023-03-27 NOTE — Progress Notes (Signed)
Nicholas Bright - 63 y.o. male MRN [id number]  Date of birth: 16-Jan-1961  Office Visit Note: Visit Date: 03/20/2023 PCP: System, Provider Not In Referred by: London Sheer, MD  Subjective: Chief Complaint  Patient presents with   Lower Back - Pain   HPI:  Nicholas Bright is a 63 y.o. male who comes in today at the request of Dr. Willia Craze for planned Left L5-S1 Lumbar Transforaminal epidural steroid injection with fluoroscopic guidance.  The patient has failed conservative care including home exercise, medications, time and activity modification.  This injection will be diagnostic and hopefully therapeutic.  Please see requesting physician notes for further details and justification.   ROS Otherwise per HPI.  Assessment & Plan: Visit Diagnoses:    ICD-10-CM   1. Lumbar radiculopathy  M54.16 XR C-ARM NO REPORT    Epidural Steroid injection    methylPREDNISolone acetate (DEPO-MEDROL) injection 40 mg      Plan: No additional findings.   Meds & Orders:  Meds ordered this encounter  Medications   methylPREDNISolone acetate (DEPO-MEDROL) injection 40 mg    Orders Placed This Encounter  Procedures   XR C-ARM NO REPORT   Epidural Steroid injection    Follow-up: Return for visit to requesting provider as needed.   Procedures: No procedures performed  Lumbosacral Transforaminal Epidural Steroid Injection - Sub-Pedicular Approach with Fluoroscopic Guidance  Patient: Nicholas Bright      Date of Birth: Aug 04, 1960 MRN: [id number] PCP: System, Provider Not In      Visit Date: 03/20/2023   Universal Protocol:    Date/Time: 03/20/2023  Consent Given By: the patient  Position: PRONE  Additional Comments: Vital signs were monitored before and after the procedure. Patient was prepped and draped in the usual sterile fashion. The correct patient, procedure, and site was verified.   Injection Procedure Details:   Procedure diagnoses: Lumbar radiculopathy [M54.16]    Meds  Administered:  Meds ordered this encounter  Medications   methylPREDNISolone acetate (DEPO-MEDROL) injection 40 mg    Laterality: Left  Location/Site: L5  Needle:5.0 in., 22 ga.  Short bevel or Quincke spinal needle  Needle Placement: Transforaminal  Findings:    -Comments: Excellent flow of contrast along the nerve, nerve root and into the epidural space.  Procedure Details: After squaring off the end-plates to get a true AP view, the C-arm was positioned so that an oblique view of the foramen as noted above was visualized. The target area is just inferior to the "nose of the scotty dog" or sub pedicular. The soft tissues overlying this structure were infiltrated with 2-3 ml. of 1% Lidocaine without Epinephrine.  The spinal needle was inserted toward the target using a "trajectory" view along the fluoroscope beam.  Under AP and lateral visualization, the needle was advanced so it did not puncture dura and was located close the 6 O'Clock position of the pedical in AP tracterory. Biplanar projections were used to confirm position. Aspiration was confirmed to be negative for CSF and/or blood. A 1-2 ml. volume of Isovue-250 was injected and flow of contrast was noted at each level. Radiographs were obtained for documentation purposes.   After attaining the desired flow of contrast documented above, a 0.5 to 1.0 ml test dose of 0.25% Marcaine was injected into each respective transforaminal space.  The patient was observed for 90 seconds post injection.  After no sensory deficits were reported, and normal lower extremity motor function was noted,   the above injectate was administered so  that equal amounts of the injectate were placed at each foramen (level) into the transforaminal epidural space.   Additional Comments:  No complications occurred Dressing: 2 x 2 sterile gauze and Band-Aid    Post-procedure details: Patient was observed during the procedure. Post-procedure instructions  were reviewed.  Patient left the clinic in stable condition.    Clinical History: MRI LUMBAR SPINE WITHOUT CONTRAST   TECHNIQUE: Multiplanar, multisequence MR imaging of the lumbar spine was performed. No intravenous contrast was administered.   COMPARISON:  None Available.   FINDINGS: Segmentation:  Standard.   Alignment:  Trace retrolisthesis of L4 on L5   Vertebrae: No fracture, evidence of discitis, or bone lesion. Likely prior left hemilaminectomy at L5-S1   Conus medullaris and cauda equina: Conus extends to the L1-L2 disc space level. Conus and cauda equina appear normal.   Paraspinal and other soft tissues: Negative.   Disc levels:   T12-L1: Unremarkable   L1-L2: Unremarkable   L2-L3: Unremarkable   L3-L4: Mild bilateral facet degenerative change. Minimal disc bulge. No spinal canal narrowing. Mild bilateral neural foraminal narrowing.   L4-L5: Moderate-sized central disc protrusion resulting in moderate spinal canal narrowing and severe narrowing of the left lateral recess. Mild left and moderate right neural foraminal narrowing. Mild bilateral facet degenerative change.   L5-S1: Mild bilateral facet degenerative change. Minimal disc bulge. No spinal canal narrowing. Moderate bilateral neural foraminal narrowing.   IMPRESSION: 1. Central disc protrusion at L4-L5 results in moderate spinal canal narrowing and severe narrowing of the left lateral recess. 2. Moderate neural foraminal narrowing on the right at L4-L5 and bilaterally at L5-S1.     Electronically Signed   By: Lorenza Cambridge M.D.   On: 03/03/2023 11:46     Objective:  VS:  HT:    WT:   BMI:     BP:   HR: bpm  TEMP: ( )  RESP:  Physical Exam Vitals and nursing note reviewed.  Constitutional:      General: He is not in acute distress.    Appearance: Normal appearance. He is not ill-appearing.  HENT:     Head: Normocephalic and atraumatic.     Right Ear: External ear normal.      Left Ear: External ear normal.     Nose: No congestion.  Eyes:     Extraocular Movements: Extraocular movements intact.  Cardiovascular:     Rate and Rhythm: Normal rate.     Pulses: Normal pulses.  Pulmonary:     Effort: Pulmonary effort is normal. No respiratory distress.  Abdominal:     General: There is no distension.     Palpations: Abdomen is soft.  Musculoskeletal:        General: No tenderness or signs of injury.     Cervical back: Neck supple.     Right lower leg: No edema.     Left lower leg: No edema.     Comments: Patient has good distal strength without clonus.  Skin:    Findings: No erythema or rash.  Neurological:     General: No focal deficit present.     Mental Status: He is alert and oriented to person, place, and time.     Sensory: No sensory deficit.     Motor: No weakness or abnormal muscle tone.     Coordination: Coordination normal.  Psychiatric:        Mood and Affect: Mood normal.        Behavior: Behavior normal.  Imaging: No results found.

## 2023-04-08 ENCOUNTER — Ambulatory Visit: Payer: BC Managed Care – PPO | Admitting: Neurology

## 2023-04-25 ENCOUNTER — Telehealth: Payer: Self-pay | Admitting: *Deleted

## 2023-04-25 MED ORDER — PAXLOVID (300/100) 20 X 150 MG & 10 X 100MG PO TBPK: 3.0000 | ORAL_TABLET | Freq: Two times a day (BID) | ORAL | 0 refills | Status: AC

## 2023-04-25 NOTE — Telephone Encounter (Signed)
 Dr. Susann Givens advised pt has Covid & GFR >60 called in Paxlovid to [facility],  [place] didn't have in stock.  Pt informed

## 2023-04-29 ENCOUNTER — Ambulatory Visit: Admitting: Sports Medicine

## 2023-04-29 ENCOUNTER — Encounter: Payer: Self-pay | Admitting: Sports Medicine

## 2023-04-29 VITALS — BP 138/87 | HR 80

## 2023-04-29 DIAGNOSIS — R0989 Other specified symptoms and signs involving the circulatory and respiratory systems: Secondary | ICD-10-CM | POA: Diagnosis not present

## 2023-04-29 DIAGNOSIS — J329 Chronic sinusitis, unspecified: Secondary | ICD-10-CM

## 2023-04-29 DIAGNOSIS — U071 COVID-19: Secondary | ICD-10-CM

## 2023-04-29 MED ORDER — AZITHROMYCIN 250 MG PO TABS: ORAL_TABLET | ORAL | 0 refills | Status: DC

## 2023-04-29 NOTE — Progress Notes (Signed)
 Nicholas Bright - 64 y.o. male MRN [id number]  Date of birth: [birth year]  Office Visit Note: Visit Date: 04/29/2023 PCP: System, Provider Not In Referred by: No ref. provider found  Subjective: Chief Complaint  Patient presents with   Chest Congestion   HPI: Nicholas Bright is a pleasant 63 y.o. male who presents today for evaluation of chest congestion and cough. 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  He tested positive for COVID on 04/22/2023 -his first day of symptoms were around Sat/Sunday of that prior weekend. He had initially been doing well and being treated conservatively.  On 2/28 he was started on Paxlovid by family medicine doctor. He did test negative for Covid antigen this Sunday.  Has been having some rattling when breathing.  Having intermittent productive cough which she feels is getting worse over the last few days instead of better.  Denies any fever or chills throughout the entirety of the illness.  He is having sinus congestion.  Pertinent ROS were reviewed with the patient and found to be negative unless otherwise specified above in HPI.   Assessment & Plan: Visit Diagnoses:  1. COVID-19   2. Chest congestion   3. Rhinosinusitis    Plan: [facility] is about 1.5 weeks out from COVID-19 infection, status post Paxlovid.  He is doing well from this but started having more chest congestion and increased cough.  He is also dealing with rhinosinusitis and some symptoms of double sickening.  Given this, I would like to treat him with azithromycin 250 mg x 5 days.  Discussed supportive care such as increased fluids, conservative management for cough - discussed with him this can linger for additional weeks.  Return precautions provided.  Follow-up: Return if symptoms worsen or fail to improve.   Meds & Orders: No orders of the defined types were placed in this encounter.  No orders of the defined types were placed in this encounter.    Procedures: - 40mg  of Depo-Medrol was administered into the  right buttock today      Clinical History:   He reports that he has never smoked. He has never used smokeless tobacco.  Recent Labs    03/19/23 0946  HGBA1C 5.7*    Objective:    Today's Vitals   04/29/23 0831  BP: 138/87  Pulse: 80     Physical Exam  Gen: Well-appearing, in no acute distress; non-toxic HEENT: There is some fullness in the maxillary sinuses, postnasal drip with posterior oropharynx cobblestoning CV: Well-perfused. Warm.  Normal S1-S2, normal rate and rhythm. Resp: Breathing unlabored on room air; there is full breath sounds on both anterior and posterior sides, no wheezing rhonchi or rails.  There is some rattling in the upper airway that does clear with coughing Psych: Fluid speech in conversation; appropriate affect; normal thought process  Past Medical/Family/Surgical/Social History: Medications & Allergies reviewed per EMR, new medications updated. 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   Past Medical History:  Diagnosis  Date   Hypertension    Neuropathy    Palpitations    Sinusitis    Family History  Problem Relation Age of Onset   Heart Problems Mother        Heart Stent   Congestive Heart Failure Father    CAD Father 4   Stroke Maternal Grandfather    Heart attack Paternal Grandfather    Past Surgical History:  Procedure Laterality Date   HARDWARE REMOVAL Left 07/30/2022    Procedure: LEFT ANKLE HARDWARE REMOVAL;  Surgeon: Huel Cote, MD;  Location: [facility];  Service: Orthopedics;  Laterality: Left;   HIP SURGERY     LUMBAR DISC SURGERY     OPEN REDUCTION INTERNAL FIXATION (ORIF) METACARPAL Left 03/14/2021   Procedure: OPEN REDUCTION INTERNAL FIXATION (ORIF) LEFT 5TH METACARPAL;  Surgeon: Marlyne Beards, MD;  Location: [facility];  Service: Orthopedics;  Laterality: Left;   ORIF ANKLE FRACTURE Left 03/14/2021   Procedure: OPEN REDUCTION INTERNAL FIXATION (ORIF) ANKLE FRACTURE;  Surgeon: Huel Cote, MD;  Location: [facility];  Service: Orthopedics;  Laterality: Left;   SKIN BIOPSY Bilateral 08/17/2020   pigmented seborrheic keratosis, irritated   Social History   Occupational History   Not on file  Tobacco Use   Smoking status: Never   Smokeless tobacco: Never  Vaping Use   Vaping status: Never Used  Substance and Sexual Activity   Alcohol use: Yes    Alcohol/week: 10.0 standard drinks of alcohol    Types: 4 Glasses of wine, 6 Cans of beer per week    Comment: moderately   Drug use: Never   Sexual activity: Yes    Partners: Female    Birth control/protection: None

## 2023-04-29 NOTE — Progress Notes (Signed)
 Patient says that he began feeling sick a little over a week ago on Sunday. He says that he tested positive for COVID on Tuesday and ultimately tested negative this past Sunday. He still has pressure and congestion in his chest which he says typically will not improve without medication. He says that he has coughs that come and go and are somewhat productive; his coughs feel deep in his chest.

## 2023-05-05 ENCOUNTER — Other Ambulatory Visit (HOSPITAL_BASED_OUTPATIENT_CLINIC_OR_DEPARTMENT_OTHER): Payer: Self-pay | Admitting: Orthopaedic Surgery

## 2023-05-05 DIAGNOSIS — M5416 Radiculopathy, lumbar region: Secondary | ICD-10-CM

## 2023-05-06 ENCOUNTER — Encounter: Payer: Self-pay | Admitting: Physical Therapy

## 2023-05-06 ENCOUNTER — Other Ambulatory Visit: Payer: Self-pay

## 2023-05-06 ENCOUNTER — Ambulatory Visit (INDEPENDENT_AMBULATORY_CARE_PROVIDER_SITE_OTHER): Admitting: Physical Therapy

## 2023-05-06 DIAGNOSIS — M6281 Muscle weakness (generalized): Secondary | ICD-10-CM

## 2023-05-06 DIAGNOSIS — M545 Low back pain, unspecified: Secondary | ICD-10-CM | POA: Diagnosis not present

## 2023-05-06 NOTE — Therapy (Signed)
 OUTPATIENT PHYSICAL THERAPY THORACOLUMBAR EVALUATION   Patient Name: Nicholas Bright MRN: [id number] DOB:April 17, 1960, 63 y.o., male Today's Date: 05/06/2023  END OF SESSION:  PT End of Session - 05/06/23 0852     Visit Number 1    Number of Visits 8    Date for PT Re-Evaluation 08/04/23    Authorization Type [employer]    PT Start Time 0806    PT Stop Time 0842    PT Time Calculation (min) 36 min    Activity Tolerance Patient tolerated treatment well    Behavior During Therapy [facility] for tasks assessed/performed             Past Medical History:  Diagnosis Date   Hypertension    Neuropathy    Palpitations    Sinusitis    Past Surgical History:  Procedure Laterality Date   HARDWARE REMOVAL Left 07/30/2022   Procedure: LEFT ANKLE HARDWARE REMOVAL;  Surgeon: Huel Cote, MD;  Location: [facility];  Service: Orthopedics;  Laterality: Left;   HIP SURGERY     LUMBAR DISC SURGERY     OPEN REDUCTION INTERNAL FIXATION (ORIF) METACARPAL Left 03/14/2021   Procedure: OPEN REDUCTION INTERNAL FIXATION (ORIF) LEFT 5TH METACARPAL;  Surgeon: Marlyne Beards, MD;  Location: [facility];  Service: Orthopedics;  Laterality: Left;   ORIF ANKLE FRACTURE Left 03/14/2021   Procedure: OPEN REDUCTION INTERNAL FIXATION (ORIF) ANKLE FRACTURE;  Surgeon: Huel Cote, MD;  Location: [facility];  Service: Orthopedics;  Laterality: Left;   SKIN BIOPSY Bilateral 08/17/2020   pigmented seborrheic keratosis, irritated   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   Displaced fracture of neck of fifth metacarpal bone, left hand, initial encounter for closed fracture    Displaced fracture of lateral malleolus of right fibula, initial encounter for closed fracture    Syndesmotic disruption of  ankle, left, initial encounter    Fracture of metacarpal, neck 03/13/2021   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 Pt feels that the back aching will go into the hip and into the toes. Pt does stretching routine that includes bridges, fig 4, HS stretching, bird dog, etc.  Pt mentions extension improves pain. Movement improves the pain. Pt had an epidural from Dr. Alvester Morin that seems to improve. 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 Pt would like consultation about appropriate exercise. Pt had negative neuropathy testing. Pt would like to return to golfing.   Exercise/gym: elliptical, banded stretching day, machine weights   PERTINENT HISTORY:  2004 discectomy   PAIN:  Are you having pain? Yes: NPRS scale: 3/10 Pain location: L hip  Pain description: aching,  Aggravating factors: fwd flexion, car rides, heavy lifting Relieving factors: exercise, movement  PRECAUTIONS: None  RED FLAGS: None   WEIGHT BEARING RESTRICTIONS: No  FALLS:  Has patient fallen in last 6 months? 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  DIAGNOSTIC FINDINGS:  Disc levels:   T12-L1: Unremarkable   L1-L2: Unremarkable   L2-L3: Unremarkable   L3-L4: Mild bilateral facet degenerative change. Minimal disc bulge. No spinal canal  narrowing. Mild bilateral neural foraminal narrowing.   L4-L5: Moderate-sized central disc protrusion resulting in moderate spinal canal narrowing and severe narrowing of the left lateral recess. Mild left and moderate right neural foraminal narrowing. Mild bilateral facet degenerative change.   L5-S1: Mild bilateral facet degenerative change. Minimal disc bulge. No spinal canal narrowing. Moderate bilateral neural foraminal narrowing.   IMPRESSION: 1. Central disc protrusion at L4-L5 results in moderate spinal canal narrowing and severe narrowing of the left lateral recess. 2. Moderate neural foraminal narrowing on the right at L4-L5 and bilaterally at L5-S1.     Electronically Signed   By: Lorenza Cambridge M.D.   On: 03/03/2023 11:46  PATIENT SURVEYS:  Modified Oswestry Modified Oswestry Low Back Pain Disability Questionnaire: 10 / 50 = 20.0 %   COGNITION: Overall cognitive status: Within functional limits for tasks assessed     SENSATION: WFL  MUSCLE LENGTH: Hamstrings: negative supine 90/90  POSTURE: No Significant postural limitations  LUMBAR ROM:   AROM eval  Flexion 100%  Extension 75%  Right lateral flexion 50%  Left lateral flexion 50%  Right rotation 100%  Left rotation 100%   (Blank rows = not tested)  LOWER EXTREMITY ROM:   WFL for tasks assessed    LOWER EXTREMITY MMT:    L4-5 myotomes intact   MMT Right eval Left eval  Hip flexion 5/5 4+/5  Hip extension    Hip abduction 5/5 5/5  Hip adduction     (Blank rows = not tested)  LUMBAR SPECIAL TESTS:  Slump test: Positive, Single leg stance test: Negative, FABER test: Positive, and Trendelenburg sign: Negative  FUNCTIONAL TESTS:  BW Squat: partial motion, lumbar stiffness, lack of hip hinging   GAIT: Distance walked: 61ft Assistive device utilized: None Level of assistance: Complete Independence Comments: WFL  TREATMENT DATE: 05/06/23  Exercises - Neutral Lumbar Spine Curl Up  - 1 x  daily - 3-4 x weekly - 2 sets - 10 reps - 2 hold - Side Plank on Knees  - 1 x daily - 3-4 x weekly - 3 sets - 10 reps - Standing Anti-Rotation Press with Anchored Resistance  - 1 x daily - 3-4 x weekly - 3 sets -  10 reps - Standing Sidebends  - 1 x daily - 3-4 x weekly - 3 sets - 10 reps - Supine 90/90 Sciatic Nerve Glide with Knee Flexion/Extension  - 1 x daily - 7 x weekly - 2 sets - 10 reps - 2 hold                                                                                                                                 PATIENT EDUCATION:  Education details: MOI, diagnosis, prognosis, anatomy, exercise progression, DOMS expectations, muscle firing,  envelope of function, HEP, POC  Person educated: Patient Education method: Explanation, Demonstration, Tactile cues, Verbal cues, and Handouts Education comprehension: verbalized understanding, returned demonstration, verbal cues required, and tactile cues required  HOME EXERCISE PROGRAM: Access Code: [email or web address] URL: [email or web address] Date: 05/06/2023 Prepared by: Zebedee Iba   ASSESSMENT:  CLINICAL IMPRESSION: Patient is a 63 y.o. male who was seen today for physical therapy evaluation and treatment for c/c of LBP. Pt's s/s appear consistent with lumbar radiculopathy and history of lumbar disc herniation. Pt's pain is moderately sensitive and irritable with movement. Pt's is more strength and mobility limited at this time. Plan to continue with progressive lumbopelvic strength and return to functional movement at future sessions. Pt would benefit from continued skilled therapy in order to reach goals and maximize functional lumbar strength and ROM for full return to PLOF.   OBJECTIVE IMPAIRMENTS Abnormal gait, decreased activity tolerance, decreased endurance, decreased mobility, decreased strength, hypomobility, increased muscle spasms, impaired flexibility, improper body mechanics, postural dysfunction, and pain.     ACTIVITY LIMITATIONS carrying, lifting, bending, sitting, standing, squatting, sleeping, stairs,  and locomotion level   PARTICIPATION LIMITATIONS: community activity, occupation, and exercise   PERSONAL FACTORS Age, Fitness, Time since onset of injury/illness/exacerbation, and 1-2 comorbidities:  are also affecting patient's functional outcome.    REHAB POTENTIAL: Good   CLINICAL DECISION MAKING: Stable/uncomplicated   EVALUATION COMPLEXITY: Low   GOALS:     SHORT TERM GOALS: Target date: 06/17/2023    Pt will become independent with HEP in order to demonstrate synthesis of PT education.     Goal status: INITIAL   2.  Pt will report at least 2 pt reduction on NPRS scale for pain at worst in order to demonstrate functional improvement with household activity, self care, and ADL.    Goal status: INITIAL      LONG TERM GOALS: Target date: 07/29/2023       Pt  will become independent with final HEP in order to demonstrate synthesis of PT education.    Goal status: INITIAL   2.  Pt will be able to demonstrate/report ability to sit/stand/sleep for extended periods of time without pain in order to demonstrate functional improvement and tolerance to static positioning.    Goal status: INITIAL   3.   Pt will be able to demonstrate/report ability to walk >45 mins without pain in order to  demonstrate functional improvement and tolerance to exercise and community mobility.     Goal status: INITIAL  4.    Pt will be able to demonstrate/report ability to take golf swings without pain in order to demonstrate functional improvement and tolerance to exercise and recreation   Goal status: INITIAL   PLAN:   PLAN: PT FREQUENCY: 1-2x/week   PT DURATION: 12 weeks (plan to D/C in 8 wks)    PLANNED INTERVENTIONS: Therapeutic exercises, Therapeutic activity, Neuromuscular re-education, Balance training, Gait training, Patient/Family education, Joint manipulation, Joint mobilization,  Stair training, Orthotic/Fit training, DME instructions, Aquatic Therapy, Dry Needling, Electrical stimulation, Spinal manipulation, Spinal mobilization, Cryotherapy, Moist heat, scar mobilization, Splintting, Taping, Vasopneumatic device, Traction, Ultrasound, Ionotophoresis 4mg /ml Dexamethasone, Manual therapy, and Re-evaluation.   PLAN FOR NEXT SESSION: progress core strength, rotational loading for return to golf, deadbug, cable chop     Zebedee Iba, PT 05/06/2023, 1:00 PM

## 2023-06-23 ENCOUNTER — Ambulatory Visit: Payer: BC Managed Care – PPO | Admitting: Neurology

## 2023-07-02 ENCOUNTER — Telehealth: Payer: Self-pay | Admitting: Physical Medicine and Rehabilitation

## 2023-07-02 ENCOUNTER — Other Ambulatory Visit: Payer: Self-pay | Admitting: Physical Medicine and Rehabilitation

## 2023-07-02 DIAGNOSIS — M5416 Radiculopathy, lumbar region: Secondary | ICD-10-CM

## 2023-07-02 NOTE — Telephone Encounter (Signed)
 Pt called requesting another back injection. Please call pt at [phone].

## 2023-07-08 ENCOUNTER — Encounter: Admitting: Physical Medicine and Rehabilitation

## 2023-07-23 ENCOUNTER — Telehealth: Payer: Self-pay | Admitting: Physical Medicine and Rehabilitation

## 2023-07-23 NOTE — Telephone Encounter (Signed)
 Pt request to reschedule appointment

## 2023-07-24 ENCOUNTER — Other Ambulatory Visit: Payer: Self-pay

## 2023-07-24 ENCOUNTER — Ambulatory Visit: Admitting: Physical Medicine and Rehabilitation

## 2023-07-24 ENCOUNTER — Encounter: Admitting: Physical Medicine and Rehabilitation

## 2023-07-24 VITALS — BP 143/96 | HR 72

## 2023-07-24 DIAGNOSIS — M5416 Radiculopathy, lumbar region: Secondary | ICD-10-CM

## 2023-07-24 MED ORDER — METHYLPREDNISOLONE ACETATE 40 MG/ML IJ SUSP
40.0000 mg | Freq: Once | INTRAMUSCULAR | Status: AC
Start: 2023-07-24 — End: 2023-07-24
  Administered 2023-07-24: 40 mg

## 2023-07-24 NOTE — Patient Instructions (Signed)

## 2023-07-24 NOTE — Progress Notes (Signed)
 Nicholas Bright - 63 y.o. male MRN [id number]  Date of birth: Feb 17, 1961  Office Visit Note: Visit Date: 07/24/2023 PCP: System, Provider Not In Referred by: Darryll Eng, NP  Subjective: Chief Complaint  Patient presents with   Lower Back - Pain   HPI:  Nicholas Bright is a 63 y.o. male who comes in today for planned repeat Left L5-S1  Lumbar Transforaminal epidural steroid injection with fluoroscopic guidance.  The patient has failed conservative care including home exercise, medications, time and activity modification.  This injection will be diagnostic and hopefully therapeutic.  Please see requesting physician notes for further details and justification. Patient received more than 50% pain relief from prior injection.   Referring: Elvan Hamel, FNP and Dr. Wilhelmenia Harada   ROS Otherwise per HPI.  Assessment & Plan: Visit Diagnoses:    ICD-10-CM   1. Lumbar radiculopathy  M54.16 XR C-ARM NO REPORT    Epidural Steroid injection    methylPREDNISolone  acetate (DEPO-MEDROL ) injection 40 mg      Plan: No additional findings.   Meds & Orders:  Meds ordered this encounter  Medications   methylPREDNISolone  acetate (DEPO-MEDROL ) injection 40 mg    Orders Placed This Encounter  Procedures   XR C-ARM NO REPORT   Epidural Steroid injection    Follow-up: Return for visit to requesting provider as needed.   Procedures: No procedures performed  Lumbosacral Transforaminal Epidural Steroid Injection - Sub-Pedicular Approach with Fluoroscopic Guidance  Patient: Nicholas Bright      Date of Birth: Jul 15, 1960 MRN: [id number] PCP: System, Provider Not In      Visit Date: 07/24/2023   Universal Protocol:    Date/Time: 07/24/2023  Consent Given By: the patient  Position: PRONE  Additional Comments: Vital signs were monitored before and after the procedure. Patient was prepped and draped in the usual sterile fashion. The correct patient, procedure, and site was  verified.   Injection Procedure Details:   Procedure diagnoses: Lumbar radiculopathy [M54.16]    Meds Administered:  Meds ordered this encounter  Medications   methylPREDNISolone  acetate (DEPO-MEDROL ) injection 40 mg    Laterality: Left  Location/Site: L5  Needle:5.0 in., 22 ga.  Short bevel or Quincke spinal needle  Needle Placement: Transforaminal  Findings:    -Comments: Excellent flow of contrast along the nerve, nerve root and into the epidural space.  Procedure Details: After squaring off the end-plates to get a true AP view, the C-arm was positioned so that an oblique view of the foramen as noted above was visualized. The target area is just inferior to the "nose of the scotty dog" or sub pedicular. The soft tissues overlying this structure were infiltrated with 2-3 ml. of 1% Lidocaine  without Epinephrine .  The spinal needle was inserted toward the target using a "trajectory" view along the fluoroscope beam.  Under AP and lateral visualization, the needle was advanced so it did not puncture dura and was located close the 6 O'Clock position of the pedical in AP tracterory. Biplanar projections were used to confirm position. Aspiration was confirmed to be negative for CSF and/or blood. A 1-2 ml. volume of Isovue-250 was injected and flow of contrast was noted at each level. Radiographs were obtained for documentation purposes.   After attaining the desired flow of contrast documented above, a 0.5 to 1.0 ml test dose of 0.25% Marcaine  was injected into each respective transforaminal space.  The patient was observed for 90 seconds post injection.  After no sensory deficits were reported, and  normal lower extremity motor function was noted,   the above injectate was administered so that equal amounts of the injectate were placed at each foramen (level) into the transforaminal epidural space.   Additional Comments:  The patient tolerated the procedure well Dressing: 2 x 2 sterile  gauze and Band-Aid    Post-procedure details: Patient was observed during the procedure. Post-procedure instructions were reviewed.  Patient left the clinic in stable condition.    Clinical History: MRI LUMBAR SPINE WITHOUT CONTRAST   TECHNIQUE: Multiplanar, multisequence MR imaging of the lumbar spine was performed. No intravenous contrast was administered.   COMPARISON:  None Available.   FINDINGS: Segmentation:  Standard.   Alignment:  Trace retrolisthesis of L4 on L5   Vertebrae: No fracture, evidence of discitis, or bone lesion. Likely prior left hemilaminectomy at L5-S1   Conus medullaris and cauda equina: Conus extends to the L1-L2 disc space level. Conus and cauda equina appear normal.   Paraspinal and other soft tissues: Negative.   Disc levels:   T12-L1: Unremarkable   L1-L2: Unremarkable   L2-L3: Unremarkable   L3-L4: Mild bilateral facet degenerative change. Minimal disc bulge. No spinal canal narrowing. Mild bilateral neural foraminal narrowing.   L4-L5: Moderate-sized central disc protrusion resulting in moderate spinal canal narrowing and severe narrowing of the left lateral recess. Mild left and moderate right neural foraminal narrowing. Mild bilateral facet degenerative change.   L5-S1: Mild bilateral facet degenerative change. Minimal disc bulge. No spinal canal narrowing. Moderate bilateral neural foraminal narrowing.   IMPRESSION: 1. Central disc protrusion at L4-L5 results in moderate spinal canal narrowing and severe narrowing of the left lateral recess. 2. Moderate neural foraminal narrowing on the right at L4-L5 and bilaterally at L5-S1.     Electronically Signed   By: Clora Dane M.D.   On: 03/03/2023 11:46     Objective:  VS:  HT:    WT:   BMI:     BP:(!) 143/96  HR:72bpm  TEMP: ( )  RESP:  Physical Exam Vitals and nursing note reviewed.  Constitutional:      General: He is not in acute distress.    Appearance:  Normal appearance. He is not ill-appearing.  HENT:     Head: Normocephalic and atraumatic.     Right Ear: External ear normal.     Left Ear: External ear normal.     Nose: No congestion.  Eyes:     Extraocular Movements: Extraocular movements intact.  Cardiovascular:     Rate and Rhythm: Normal rate.     Pulses: Normal pulses.  Pulmonary:     Effort: Pulmonary effort is normal. No respiratory distress.  Abdominal:     General: There is no distension.     Palpations: Abdomen is soft.  Musculoskeletal:        General: No tenderness or signs of injury.     Cervical back: Neck supple.     Right lower leg: No edema.     Left lower leg: No edema.     Comments: Patient has good distal strength without clonus.  Skin:    Findings: No erythema or rash.  Neurological:     General: No focal deficit present.     Mental Status: He is alert and oriented to person, place, and time.     Sensory: No sensory deficit.     Motor: No weakness or abnormal muscle tone.     Coordination: Coordination normal.  Psychiatric:  Mood and Affect: Mood normal.        Behavior: Behavior normal.      Imaging: No results found.

## 2023-07-24 NOTE — Procedures (Signed)
 Lumbosacral Transforaminal Epidural Steroid Injection - Sub-Pedicular Approach with Fluoroscopic Guidance  Patient: Nicholas Bright      Date of Birth: November 11, 1960 MRN: [id number] PCP: System, Provider Not In      Visit Date: 07/24/2023   Universal Protocol:    Date/Time: 07/24/2023  Consent Given By: the patient  Position: PRONE  Additional Comments: Vital signs were monitored before and after the procedure. Patient was prepped and draped in the usual sterile fashion. The correct patient, procedure, and site was verified.   Injection Procedure Details:   Procedure diagnoses: Lumbar radiculopathy [M54.16]    Meds Administered:  Meds ordered this encounter  Medications   methylPREDNISolone  acetate (DEPO-MEDROL ) injection 40 mg    Laterality: Left  Location/Site: L5  Needle:5.0 in., 22 ga.  Short bevel or Quincke spinal needle  Needle Placement: Transforaminal  Findings:    -Comments: Excellent flow of contrast along the nerve, nerve root and into the epidural space.  Procedure Details: After squaring off the end-plates to get a true AP view, the C-arm was positioned so that an oblique view of the foramen as noted above was visualized. The target area is just inferior to the "nose of the scotty dog" or sub pedicular. The soft tissues overlying this structure were infiltrated with 2-3 ml. of 1% Lidocaine  without Epinephrine .  The spinal needle was inserted toward the target using a "trajectory" view along the fluoroscope beam.  Under AP and lateral visualization, the needle was advanced so it did not puncture dura and was located close the 6 O'Clock position of the pedical in AP tracterory. Biplanar projections were used to confirm position. Aspiration was confirmed to be negative for CSF and/or blood. A 1-2 ml. volume of Isovue-250 was injected and flow of contrast was noted at each level. Radiographs were obtained for documentation purposes.   After attaining the desired flow  of contrast documented above, a 0.5 to 1.0 ml test dose of 0.25% Marcaine  was injected into each respective transforaminal space.  The patient was observed for 90 seconds post injection.  After no sensory deficits were reported, and normal lower extremity motor function was noted,   the above injectate was administered so that equal amounts of the injectate were placed at each foramen (level) into the transforaminal epidural space.   Additional Comments:  The patient tolerated the procedure well Dressing: 2 x 2 sterile gauze and Band-Aid    Post-procedure details: Patient was observed during the procedure. Post-procedure instructions were reviewed.  Patient left the clinic in stable condition.

## 2023-07-24 NOTE — Progress Notes (Signed)
 Pain Scale   Average Pain 3 Patient advising his lower back pain is worse when sitting and Driving for log periods of time.        +Driver, -BT, -Dye Allergies.

## 2023-10-10 ENCOUNTER — Encounter: Payer: Self-pay | Admitting: Sports Medicine

## 2023-10-10 ENCOUNTER — Ambulatory Visit: Admitting: Sports Medicine

## 2023-10-10 VITALS — BP 138/89 | HR 65 | Ht 75.0 in | Wt 212.0 lb

## 2023-10-10 DIAGNOSIS — E559 Vitamin D deficiency, unspecified: Secondary | ICD-10-CM | POA: Diagnosis not present

## 2023-10-10 DIAGNOSIS — E569 Vitamin deficiency, unspecified: Secondary | ICD-10-CM | POA: Diagnosis not present

## 2023-10-10 DIAGNOSIS — Z125 Encounter for screening for malignant neoplasm of prostate: Secondary | ICD-10-CM | POA: Diagnosis not present

## 2023-10-10 DIAGNOSIS — Z0000 Encounter for general adult medical examination without abnormal findings: Secondary | ICD-10-CM

## 2023-10-10 DIAGNOSIS — Z8249 Family history of ischemic heart disease and other diseases of the circulatory system: Secondary | ICD-10-CM

## 2023-10-10 NOTE — Progress Notes (Signed)
 Nicholas Bright - 63 y.o. male MRN [id number]  Date of birth: [birth year]  Office Visit Note: Visit Date: 10/10/2023 PCP: System, Provider Not In Referred by: No ref. provider found  Subjective: No chief complaint on file.  HPI: Nicholas Bright is a pleasant 63 y.o. male who presents today for follow-up of general health maintenance. 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  He is a healthy and active male. He does not take any prescription medications on a consistent basis.  He does check his blood pressure at [facility] training room and is usually in the 130s over 80s, occasionally will have BPs in the 140s over 90s but less frequent.  He is taking both vitamin D  and a B complex supplement. He denies any personal history of CAD, diabetes or CKD. We last ordered labs back in January of 2025:  - Borderline A1c in-terms of pre-diabetes  - Mildly elevated LDL, but total CHOL < 200 Lab Results  Component Value Date   HGBA1C 5.7 (H) 03/19/2023   Lab Results  Component Value Date   CHOL 196 03/19/2023   HDL 66 03/19/2023   LDLCALC 107 (H) 03/19/2023   TRIG 119 03/19/2023   CHOLHDL 3.0 03/19/2023   He has been working out, Estate manager/land agent and lifestyle changes.  He also would like to see cardiology again given his strong family history, consideration of repeating coronary calcium scoring.  Saw Dr. Lavona in 2022 for abnormal RSR prime, but was without issue.   He also would like prostate cancer screening.  Has not had PSA anytime in the last number of years.  He does wake at night to urinate once, sometimes twice nightly.  No other changes in the short-term.  Notable family history: - Mother: CAD with cardiac stenting - Father: Was a lifelong smoker, CAD status post CABG, coronary artery stenosis, pacemaker  Pertinent ROS were reviewed with the patient and found to be negative unless otherwise specified above in HPI.   Assessment & Plan: Visit Diagnoses:  1. Health maintenance examination   2. Vitamin D   deficiency   3. Prostate cancer screening   4. Vitamin deficiency   5. Family history of coronary artery disease    Plan: [name] is following up for generalized health maintenance and would like to repeat laboratory evaluation to ensure he is adequate in terms of a health maintenance perspective.  He also has history of vitamin deficiency, but is taking vitamin D  and B complex.  We will add vitamin D  and magnesium given this and his borderline higher blood pressures.  We discussed the risk and benefits of prostate cancer screening which he has not had for quite some time, we will obtain PSA today.  Will send referral back to cardiology given it is been 3 years with his abnormal RSR prime in the past that was worked up to be normal.  He does have a strong family history of CAD and heart disease with a personal history of borderline blood pressures in the 130s over 80s, sometimes > 140/90 but not consistently. Referral sent back to Dr. Lavona.  Once his labs result, I will reach out to him in terms of next steps if any follow-up or intervention is needed.  Otherwise we will follow-up in about 6 months for his annual physical.  Follow-up: Return in about 6 months (around 04/11/2024).   Meds & Orders: No orders of the defined types were placed in this encounter.   Orders Placed This Encounter  Procedures  CBC with Differential   Comprehensive Metabolic Panel (CMET)   Lipid panel   Vitamin D  (25 hydroxy)   Magnesium   PSA   Ambulatory referral to Cardiology    Procedures: No procedures performed      Clinical History:   He reports that he has never smoked. He has never used smokeless tobacco.  Recent Labs    03/19/23 0946  HGBA1C 5.7*    Objective:   Vital Signs: BP 138/89 (BP Location: Right Arm, Patient Position: Sitting)   Pulse 65   Ht 6' 3 (1.905 m)   Wt 212 lb (96.2 kg)   BMI 26.50 kg/m   Physical Exam  Gen: Well-appearing, in no acute distress; non-toxic HENT:  Normocephalic, atraumatic; clear nasal turbinates; no redness or swelling of the posterior oropharynx  CV: Regular rate, normal S1 and S2.  No murmurs gallops or rubs. Well-perfused. Warm.  Resp: Breathing unlabored on room air, full breath sounds in both anterior and posterior lung fields; no wheezing, rhonchi or rales  Psych: Fluid speech in conversation; appropriate affect; normal thought process  Imaging: No results found.  Past Medical/Family/Surgical/Social History: Medications & Allergies reviewed per EMR, new medications updated. 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   Past Medical History:  Diagnosis Date   Hypertension    Neuropathy    Palpitations    Sinusitis    Family History  Problem Relation Age of Onset   Heart Problems Mother        Heart Stent   Congestive Heart Failure Father    CAD Father 36   Stroke Maternal Grandfather    Heart attack Paternal Grandfather    Past Surgical History:  Procedure Laterality Date   HARDWARE REMOVAL Left 07/30/2022   Procedure: LEFT ANKLE HARDWARE REMOVAL;  Surgeon: Genelle Standing, MD;  Location: [facility];  Service: Orthopedics;  Laterality: Left;   HIP SURGERY     LUMBAR DISC SURGERY     OPEN REDUCTION INTERNAL FIXATION (ORIF) METACARPAL Left 03/14/2021   Procedure: OPEN REDUCTION INTERNAL FIXATION (ORIF) LEFT 5TH METACARPAL;  Surgeon: Romona Harari, MD;  Location: [facility];  Service: Orthopedics;  Laterality: Left;   ORIF ANKLE FRACTURE Left 03/14/2021   Procedure: OPEN REDUCTION INTERNAL FIXATION (ORIF) ANKLE FRACTURE;  Surgeon: Genelle Standing, MD;  Location: [facility];  Service: Orthopedics;  Laterality: Left;   SKIN BIOPSY Bilateral 08/17/2020   pigmented seborrheic keratosis, irritated   Social History   Occupational History   Not on file  Tobacco Use   Smoking status: Never   Smokeless tobacco: Never  Vaping Use   Vaping status: Never Used  Substance and Sexual Activity   Alcohol use: Yes    Alcohol/week: 10.0 standard drinks of alcohol    Types: 4 Glasses of wine, 6 Cans of beer per week    Comment: moderately   Drug use: Never   Sexual activity: Yes    Partners: Female  Birth control/protection: None

## 2023-10-11 LAB — LIPID PANEL
Cholesterol: 194 mg/dL (ref <200)
HDL: 66 mg/dL (ref >=40)
LDL Cholesterol (Calc): 107 mg/dL — ABNORMAL HIGH
Non-HDL Cholesterol (Calc): 128 mg/dL (ref <130)
Total CHOL/HDL Ratio: 2.9 (calc) (ref <5.0)
Triglycerides: 117 mg/dL (ref <150)

## 2023-10-11 LAB — CBC WITH DIFFERENTIAL/PLATELET
Absolute Lymphocytes: 1417 {cells}/uL (ref 850–3900)
Absolute Monocytes: 611 {cells}/uL (ref 200–950)
Basophils Absolute: 39 {cells}/uL (ref 0–200)
Basophils Relative: 0.6 %
Eosinophils Absolute: 221 {cells}/uL (ref 15–500)
Eosinophils Relative: 3.4 %
HCT: 47.2 % (ref 38.5–50.0)
Hemoglobin: 15.7 g/dL (ref 13.2–17.1)
MCH: 33.2 pg — ABNORMAL HIGH (ref 27.0–33.0)
MCHC: 33.3 g/dL (ref 32.0–36.0)
MCV: 99.8 fL (ref 80.0–100.0)
MPV: 10.8 fL (ref 7.5–12.5)
Monocytes Relative: 9.4 %
Neutro Abs: 4212 {cells}/uL (ref 1500–7800)
Neutrophils Relative %: 64.8 %
Platelets: 202 Thousand/uL (ref 140–400)
RBC: 4.73 Million/uL (ref 4.20–5.80)
RDW: 12.1 % (ref 11.0–15.0)
Total Lymphocyte: 21.8 %
WBC: 6.5 Thousand/uL (ref 3.8–10.8)

## 2023-10-11 LAB — PSA: PSA: 1.49 ng/mL (ref <=4.00)

## 2023-10-11 LAB — COMPREHENSIVE METABOLIC PANEL WITH GFR
AG Ratio: 1.9 (calc) (ref 1.0–2.5)
ALT: 32 U/L (ref 9–46)
AST: 35 U/L (ref 10–35)
Albumin: 4.7 g/dL (ref 3.6–5.1)
Alkaline phosphatase (APISO): 54 U/L (ref 35–144)
BUN: 10 mg/dL (ref 7–25)
CO2: 25 mmol/L (ref 20–32)
Calcium: 9.8 mg/dL (ref 8.6–10.3)
Chloride: 102 mmol/L (ref 98–110)
Creat: 0.76 mg/dL (ref 0.70–1.35)
Globulin: 2.5 g/dL (ref 1.9–3.7)
Glucose, Bld: 104 mg/dL — ABNORMAL HIGH (ref 65–99)
Potassium: 4.1 mmol/L (ref 3.5–5.3)
Sodium: 136 mmol/L (ref 135–146)
Total Bilirubin: 0.9 mg/dL (ref 0.2–1.2)
Total Protein: 7.2 g/dL (ref 6.1–8.1)
eGFR: 100 mL/min/1.73m2 (ref >=60)

## 2023-10-11 LAB — VITAMIN D 25 HYDROXY (VIT D DEFICIENCY, FRACTURES): Vit D, 25-Hydroxy: 42 ng/mL (ref 30–100)

## 2023-10-11 LAB — MAGNESIUM: Magnesium: 2.2 mg/dL (ref 1.5–2.5)

## 2023-10-13 ENCOUNTER — Ambulatory Visit: Payer: Self-pay | Admitting: Sports Medicine

## 2023-12-29 ENCOUNTER — Encounter: Payer: Self-pay | Admitting: Radiology

## 2024-01-05 ENCOUNTER — Ambulatory Visit: Admitting: Sports Medicine

## 2024-01-05 ENCOUNTER — Encounter: Payer: Self-pay | Admitting: Sports Medicine

## 2024-01-05 VITALS — BP 132/86 | HR 74

## 2024-01-05 DIAGNOSIS — R0989 Other specified symptoms and signs involving the circulatory and respiratory systems: Secondary | ICD-10-CM

## 2024-01-05 DIAGNOSIS — J329 Chronic sinusitis, unspecified: Secondary | ICD-10-CM | POA: Diagnosis not present

## 2024-01-05 MED ORDER — AZITHROMYCIN 250 MG PO TABS: ORAL_TABLET | ORAL | 0 refills | Status: DC

## 2024-01-05 NOTE — Progress Notes (Signed)
 Nicholas Bright - 63 y.o. male MRN [id number]  Date of birth: 09/05/1960  Office Visit Note: Visit Date: 01/05/2024 PCP: System, Provider Not In Referred by: No ref. provider found  Subjective: No chief complaint on file.  HPI: Nicholas Bright is a pleasant 63 y.o. male who presents today for evaluation of recent illness with sinus congestion, nasal drainage, headache.  Symptoms started last week. This initially started out as nasal drainage and he was over this weekend started to turn worse.  Feels like it is settling more in the chest.  He feels more congested in the nose and the sinuses as well.  Associated headache.  He denies any shortness of breath.  He has been using DayQuil and ibuprofen  as needed.  He does take vitamin D  and a B complex consistently.  He had the same symptoms last year and resolved quickly with a Z-Pak.  Pertinent ROS were reviewed with the patient and found to be negative unless otherwise specified above in HPI.   Assessment & Plan: Visit Diagnoses:  1. Rhinosinusitis   2. Chest congestion    Plan: Impression is subacute rhinosinusitis.  Patient started last week and was initially improving although over the weekend worse, more with sinus congestion and feeling a deep within his chest.  Lungs are clear to auscultation however.  Given similar symptoms that happened last year and good response, we will proceed with 5-day azithromycin  course.  Did provide IM methylprednisolone  as well for symptom relief.  Discussed importance of adequate sleep, pushing hydration, recommended vitamin C 1000 mg daily for the next week.  He will follow-up with me if not improved over the next 7-10 days.  Follow-up: Return if symptoms worsen or fail to improve.   Meds & Orders:  Meds ordered this encounter  Medications   azithromycin  (ZITHROMAX ) 250 MG tablet    Sig: Take 2 tablets on day 1, followed by 1 tablet for the next 4 days    Dispense:  6 each    Refill:  0   No orders of the  defined types were placed in this encounter.    Procedures: *1 cc of prednisone 40 mg IM administered into the right buttock today     Clinical History:   He reports that he has never smoked. He has never used smokeless tobacco.  Recent Labs    03/19/23 0946  HGBA1C 5.7*    Objective:   Vital Signs: BP 132/86 (BP Location: Left Arm, Patient Position: Sitting)   Pulse 74   Physical Exam  Gen: Well-appearing, in no acute distress; non-toxic CV: Well-perfused. Warm.  HEENT: There is some clear-green rhinorrhea present.  There is bogginess over the maxillary and frontal sinuses bilaterally.  Posterior oropharynx with cobblestoning although no exudates noted. Resp: Breathing unlabored on room air; no wheezing.  Full breath sounds anteriorly and posteriorly. Psych: Fluid speech in conversation; appropriate affect; normal thought process  Imaging: No results found.  Past Medical/Family/Surgical/Social History: Medications & Allergies reviewed per EMR, new medications updated. 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   Past Medical History:  Diagnosis Date   Hypertension    Neuropathy    Palpitations    Sinusitis    Family History  Problem Relation Age of Onset   Heart Problems Mother        Heart Stent   Congestive Heart Failure Father    CAD Father 13   Stroke Maternal Grandfather    Heart attack Paternal  Grandfather    Past Surgical History:  Procedure Laterality Date   HARDWARE REMOVAL Left 07/30/2022   Procedure: LEFT ANKLE HARDWARE REMOVAL;  Surgeon: Genelle Standing, MD;  Location: [facility];  Service: Orthopedics;  Laterality: Left;   HIP SURGERY     LUMBAR DISC SURGERY     OPEN REDUCTION INTERNAL FIXATION (ORIF) METACARPAL Left 03/14/2021   Procedure: OPEN REDUCTION INTERNAL FIXATION (ORIF) LEFT 5TH METACARPAL;  Surgeon: Romona Harari, MD;  Location: [facility];  Service: Orthopedics;  Laterality: Left;   ORIF ANKLE FRACTURE Left 03/14/2021   Procedure: OPEN REDUCTION INTERNAL FIXATION (ORIF) ANKLE FRACTURE;  Surgeon: Genelle Standing, MD;  Location: [facility];  Service: Orthopedics;  Laterality: Left;   SKIN BIOPSY Bilateral 08/17/2020   pigmented seborrheic keratosis, irritated   Social History   Occupational History   Not on file  Tobacco Use   Smoking status: Never   Smokeless tobacco: Never  Vaping Use   Vaping status: Never Used  Substance and Sexual Activity   Alcohol use: Yes    Alcohol/week: 10.0 standard drinks of alcohol    Types: 4 Glasses of wine, 6 Cans of beer per week    Comment: moderately   Drug use: Never   Sexual activity: Yes    Partners: Female    Birth control/protection: None

## 2024-01-05 NOTE — Progress Notes (Signed)
 Patient says that he began having drainage last Wednesday, and by the weekend he was not feeling well and felt that it was settling in his chest. He has taken DayQuil and Ibuprofen  for his symptoms and headache.

## 2024-01-15 NOTE — Progress Notes (Deleted)
  Cardiology Office Note:   Date:  01/15/2024  ID:  Nicholas Bright, DOB 10/30/1960, MRN [id number] PCP: System, Provider Not In  [facility] Providers Cardiologist:  Lynwood Schilling, MD {  History of Present Illness:   Nicholas Bright is a 63 y.o. male who I saw in Jan 2024 with abnormal EKG.  ***     who was for evaluation of to see if he an abnormal EKG. Since I last saw him he has done well.   The patient denies any new symptoms such as chest discomfort, neck or arm discomfort. There has been no new shortness of breath, PND or orthopnea. There have been no reported palpitations, presyncope or syncope. He exercises routinely.     ROS: ***  Studies Reviewed:    EKG:       ***  Risk Assessment/Calculations:   {Does this patient have ATRIAL FIBRILLATION?:[phone]} No BP recorded.  {Refresh Note OR Click here to enter BP  :1}***        Physical Exam:   VS:  There were no vitals taken for this visit.   Wt Readings from Last 3 Encounters:  10/10/23 212 lb (96.2 kg)  03/19/23 210 lb (95.3 kg)  07/30/22 210 lb 5.1 oz (95.4 kg)     GEN: Well nourished, well developed in no acute distress NECK: No JVD; No carotid bruits CARDIAC: ***RR, *** murmurs, rubs, gallops RESPIRATORY:  Clear to auscultation without rales, wheezing or rhonchi  ABDOMEN: Soft, non-tender, non-distended EXTREMITIES:  No edema; No deformity   ASSESSMENT AND PLAN:   Abnormal EKG: ***  Patient had RSR prime V1 and V2.  There are no other acute pathologic findings.  No change in therapy.    Risk reduction:   ***   I will be checking an LP(a).  Calcium score was minimally elevated.   HTN: His blood pressure is ***  mildly elevated.  He is going to keep a blood pressure diary and I gave him the brand of low blood pressure cuff to 5.  It is likely that he is getting needs therapy and might start with an ARB but will defer to his primary.   Dyslipidemia: His LDL is *** 115 with an HDL of 68.  No change in  therapy.    Palpitations:   ***  He has no symptoms related to this.  No change in therapy.     Follow up ***  Signed, Lynwood Schilling, MD

## 2024-01-16 ENCOUNTER — Ambulatory Visit: Admitting: Cardiology

## 2024-01-16 DIAGNOSIS — I10 Essential (primary) hypertension: Secondary | ICD-10-CM

## 2024-01-16 DIAGNOSIS — R9431 Abnormal electrocardiogram [ECG] [EKG]: Secondary | ICD-10-CM

## 2024-01-16 DIAGNOSIS — R002 Palpitations: Secondary | ICD-10-CM

## 2024-01-16 DIAGNOSIS — E785 Hyperlipidemia, unspecified: Secondary | ICD-10-CM

## 2024-03-03 ENCOUNTER — Telehealth: Payer: Self-pay | Admitting: Physical Medicine and Rehabilitation

## 2024-03-03 ENCOUNTER — Other Ambulatory Visit: Payer: Self-pay | Admitting: Physical Medicine and Rehabilitation

## 2024-03-03 DIAGNOSIS — M5416 Radiculopathy, lumbar region: Secondary | ICD-10-CM

## 2024-03-03 NOTE — Telephone Encounter (Signed)
 Pt called wanting to make an apt with Dr. Eldonna concerning his twice yearly injection. Call back number is [phone].

## 2024-03-08 ENCOUNTER — Telehealth: Payer: Self-pay

## 2024-03-08 NOTE — Telephone Encounter (Signed)
 Patient is scheduled for injection tomorrow 03/08/24 and has a function for 2 hours later that evening asking if he will be able to attend the function.

## 2024-03-09 ENCOUNTER — Other Ambulatory Visit: Payer: Self-pay

## 2024-03-09 ENCOUNTER — Ambulatory Visit: Admitting: Physical Medicine and Rehabilitation

## 2024-03-09 VITALS — BP 141/90 | HR 93

## 2024-03-09 DIAGNOSIS — M5416 Radiculopathy, lumbar region: Secondary | ICD-10-CM | POA: Diagnosis not present

## 2024-03-09 MED ORDER — METHYLPREDNISOLONE ACETATE 40 MG/ML IJ SUSP
40.0000 mg | Freq: Once | INTRAMUSCULAR | Status: AC
  Administered 2024-03-09: 40 mg

## 2024-03-09 NOTE — Progress Notes (Signed)
 Pain Scale   Average Pain 6 Patient advising he has chronic lower back pain that is constant.        +Driver, -BT, -Dye Allergies.

## 2024-03-12 ENCOUNTER — Ambulatory Visit: Admitting: Cardiology

## 2024-03-21 NOTE — Procedures (Signed)
 Lumbosacral Transforaminal Epidural Steroid Injection - Sub-Pedicular Approach with Fluoroscopic Guidance  Patient: Nicholas Bright      Date of Birth: [birth year] MRN: [id number] PCP: System, Provider Not In      Visit Date: 03/09/2024   Universal Protocol:    Date/Time: 03/09/2024  Consent Given By: the patient  Position: PRONE  Additional Comments: Vital signs were monitored before and after the procedure. Patient was prepped and draped in the usual sterile fashion. The correct patient, procedure, and site was verified.   Injection Procedure Details:   Procedure diagnoses: Lumbar radiculopathy [M54.16]    Meds Administered:  Meds ordered this encounter  Medications   methylPREDNISolone  acetate (DEPO-MEDROL ) injection 40 mg    Laterality: Left  Location/Site: L5  Needle:5.0 in., 22 ga.  Short bevel or Quincke spinal needle  Needle Placement: Transforaminal  Findings:    -Comments: Excellent flow of contrast along the nerve, nerve root and into the epidural space.  Procedure Details: After squaring off the end-plates to get a true AP view, the C-arm was positioned so that an oblique view of the foramen as noted above was visualized. The target area is just inferior to the nose of the scotty dog or sub pedicular. The soft tissues overlying this structure were infiltrated with 2-3 ml. of 1% Lidocaine  without Epinephrine .  The spinal needle was inserted toward the target using a trajectory view along the fluoroscope beam.  Under AP and lateral visualization, the needle was advanced so it did not puncture dura and was located close the 6 O'Clock position of the pedical in AP tracterory. Biplanar projections were used to confirm position. Aspiration was confirmed to be negative for CSF and/or blood. A 1-2 ml. volume of Isovue-250 was injected and flow of contrast was noted at each level. Radiographs were obtained for documentation purposes.   After attaining the desired flow  of contrast documented above, a 0.5 to 1.0 ml test dose of 0.25% Marcaine  was injected into each respective transforaminal space.  The patient was observed for 90 seconds post injection.  After no sensory deficits were reported, and normal lower extremity motor function was noted,   the above injectate was administered so that equal amounts of the injectate were placed at each foramen (level) into the transforaminal epidural space.   Additional Comments:  The patient tolerated the procedure well Dressing: 2 x 2 sterile gauze and Band-Aid    Post-procedure details: Patient was observed during the procedure. Post-procedure instructions were reviewed.  Patient left the clinic in stable condition.

## 2024-03-21 NOTE — Progress Notes (Signed)
 "  Nicholas Bright - 64 y.o. male MRN [id number]  Date of birth: [birth year]  Office Visit Note: Visit Date: 03/09/2024 PCP: System, Provider Not In Referred by: Trudy Duwaine BRAVO, NP  Subjective: Chief Complaint  Patient presents with   Lower Back - Pain   HPI:  Nicholas Bright is a 64 y.o. male who comes in today at the request of Duwaine Trudy, FNP for planned Left L5-S1 Lumbar Transforaminal epidural steroid injection with fluoroscopic guidance.  The patient has failed conservative care including home exercise, medications, time and activity modification.  This injection will be diagnostic and hopefully therapeutic.  Please see requesting physician notes for further details and justification.   ROS Otherwise per HPI.  Assessment & Plan: Visit Diagnoses:    ICD-10-CM   1. Lumbar radiculopathy  M54.16 XR C-ARM NO REPORT    Epidural Steroid injection    methylPREDNISolone  acetate (DEPO-MEDROL ) injection 40 mg      Plan: No additional findings.   Meds & Orders:  Meds ordered this encounter  Medications   methylPREDNISolone  acetate (DEPO-MEDROL ) injection 40 mg    Orders Placed This Encounter  Procedures   XR C-ARM NO REPORT   Epidural Steroid injection    Follow-up: Return for visit to requesting provider as needed.   Procedures: No procedures performed  Lumbosacral Transforaminal Epidural Steroid Injection - Sub-Pedicular Approach with Fluoroscopic Guidance  Patient: Nicholas Bright      Date of Birth: [birth year] MRN: [id number] PCP: System, Provider Not In      Visit Date: 03/09/2024   Universal Protocol:    Date/Time: 03/09/2024  Consent Given By: the patient  Position: PRONE  Additional Comments: Vital signs were monitored before and after the procedure. Patient was prepped and draped in the usual sterile fashion. The correct patient, procedure, and site was verified.   Injection Procedure Details:   Procedure diagnoses: Lumbar radiculopathy [M54.16]    Meds  Administered:  Meds ordered this encounter  Medications   methylPREDNISolone  acetate (DEPO-MEDROL ) injection 40 mg    Laterality: Left  Location/Site: L5  Needle:5.0 in., 22 ga.  Short bevel or Quincke spinal needle  Needle Placement: Transforaminal  Findings:    -Comments: Excellent flow of contrast along the nerve, nerve root and into the epidural space.  Procedure Details: After squaring off the end-plates to get a true AP view, the C-arm was positioned so that an oblique view of the foramen as noted above was visualized. The target area is just inferior to the nose of the scotty dog or sub pedicular. The soft tissues overlying this structure were infiltrated with 2-3 ml. of 1% Lidocaine  without Epinephrine .  The spinal needle was inserted toward the target using a trajectory view along the fluoroscope beam.  Under AP and lateral visualization, the needle was advanced so it did not puncture dura and was located close the 6 O'Clock position of the pedical in AP tracterory. Biplanar projections were used to confirm position. Aspiration was confirmed to be negative for CSF and/or blood. A 1-2 ml. volume of Isovue-250 was injected and flow of contrast was noted at each level. Radiographs were obtained for documentation purposes.   After attaining the desired flow of contrast documented above, a 0.5 to 1.0 ml test dose of 0.25% Marcaine  was injected into each respective transforaminal space.  The patient was observed for 90 seconds post injection.  After no sensory deficits were reported, and normal lower extremity motor function was noted,   the above injectate was administered  so that equal amounts of the injectate were placed at each foramen (level) into the transforaminal epidural space.   Additional Comments:  The patient tolerated the procedure well Dressing: 2 x 2 sterile gauze and Band-Aid    Post-procedure details: Patient was observed during the procedure. Post-procedure  instructions were reviewed.  Patient left the clinic in stable condition.    Clinical History: MRI LUMBAR SPINE WITHOUT CONTRAST   TECHNIQUE: Multiplanar, multisequence MR imaging of the lumbar spine was performed. No intravenous contrast was administered.   COMPARISON:  None Available.   FINDINGS: Segmentation:  Standard.   Alignment:  Trace retrolisthesis of L4 on L5   Vertebrae: No fracture, evidence of discitis, or bone lesion. Likely prior left hemilaminectomy at L5-S1   Conus medullaris and cauda equina: Conus extends to the L1-L2 disc space level. Conus and cauda equina appear normal.   Paraspinal and other soft tissues: Negative.   Disc levels:   T12-L1: Unremarkable   L1-L2: Unremarkable   L2-L3: Unremarkable   L3-L4: Mild bilateral facet degenerative change. Minimal disc bulge. No spinal canal narrowing. Mild bilateral neural foraminal narrowing.   L4-L5: Moderate-sized central disc protrusion resulting in moderate spinal canal narrowing and severe narrowing of the left lateral recess. Mild left and moderate right neural foraminal narrowing. Mild bilateral facet degenerative change.   L5-S1: Mild bilateral facet degenerative change. Minimal disc bulge. No spinal canal narrowing. Moderate bilateral neural foraminal narrowing.   IMPRESSION: 1. Central disc protrusion at L4-L5 results in moderate spinal canal narrowing and severe narrowing of the left lateral recess. 2. Moderate neural foraminal narrowing on the right at L4-L5 and bilaterally at L5-S1.     Electronically Signed   By: Lyndall Gore M.D.   On: 03/03/2023 11:46     Objective:  VS:  HT:    WT:   BMI:     BP:(!) 141/90  HR:93bpm  TEMP: ( )  RESP:  Physical Exam Vitals and nursing note reviewed.  Constitutional:      General: He is not in acute distress.    Appearance: Normal appearance. He is not ill-appearing.  HENT:     Head: Normocephalic and atraumatic.     Right Ear:  External ear normal.     Left Ear: External ear normal.     Nose: No congestion.  Eyes:     Extraocular Movements: Extraocular movements intact.  Cardiovascular:     Rate and Rhythm: Normal rate.     Pulses: Normal pulses.  Pulmonary:     Effort: Pulmonary effort is normal. No respiratory distress.  Abdominal:     General: There is no distension.     Palpations: Abdomen is soft.  Musculoskeletal:        General: No tenderness or signs of injury.     Cervical back: Neck supple.     Right lower leg: No edema.     Left lower leg: No edema.     Comments: Patient has good distal strength without clonus.  Skin:    Findings: No erythema or rash.  Neurological:     General: No focal deficit present.     Mental Status: He is alert and oriented to person, place, and time.     Sensory: No sensory deficit.     Motor: No weakness or abnormal muscle tone.     Coordination: Coordination normal.  Psychiatric:        Mood and Affect: Mood normal.  Behavior: Behavior normal.      Imaging: No results found. "

## 2024-03-23 DIAGNOSIS — I10 Essential (primary) hypertension: Secondary | ICD-10-CM | POA: Insufficient documentation

## 2024-03-23 NOTE — Progress Notes (Unsigned)
 " Cardiology Office Note:   Date:  03/25/2024  ID:  Nicholas Bright, DOB 1960-07-06, MRN [id number] PCP: System, Provider Not In  [facility] Providers Cardiologist:  Lynwood Schilling, MD {  History of Present Illness:   Nicholas Bright is a 64 y.o. male who was seen for evaluation of to see if he an abnormal EKG. He had previously had some premature beats and an RSR prime in V1 and V2.  He did not have any significant dysrhythmias no significant symptoms.  He did have minimally elevated coronary calcium  in the 36 percentile in 2022.    He returns for follow-up.  He has had no new cardiovascular symptoms.  He is a little bit limited by herniated disc that hurts him occasionally.  He does however get his heart rate up on the elliptical.  He exercises routinely.  He is not having any chest pressure, neck or arm discomfort.  He is not having any new palpitations, presyncope or syncope.  He is having no new shortness of breath, PND or orthopnea.  He has had no weight gain or edema.   ROS: As stated in the HPI and negative for all other systems.  Studies Reviewed:    EKG:   EKG Interpretation Date/Time:  Thursday March 25 2024 15:18:45 EST Ventricular Rate:  74 PR Interval:  168 QRS Duration:  100 QT Interval:  392 QTC Calculation: 435 R Axis:   57  Text Interpretation: Sinus rhythm with Blocked Premature atrial complexes PVCs Nonspecific ST abnormality arrhythmias is new since previous Confirmed by Schilling Rattan ([id number]) on 03/25/2024 3:58:18 PM    Risk Assessment/Calculations:       Physical Exam:   VS:  BP (!) 140/90 (BP Location: Left Arm, Patient Position: Sitting, Cuff Size: Normal)   Pulse 70   Ht 6' 3 (1.905 m)   Wt 212 lb (96.2 kg)   SpO2 96%   BMI 26.50 kg/m    Wt Readings from Last 3 Encounters:  03/25/24 212 lb (96.2 kg)  10/10/23 212 lb (96.2 kg)  03/19/23 210 lb (95.3 kg)     GEN: Well nourished, well developed in no acute distress NECK: No JVD; No carotid  bruits CARDIAC: RRR, no murmurs, rubs, gallops RESPIRATORY:  Clear to auscultation without rales, wheezing or rhonchi  ABDOMEN: Soft, non-tender, non-distended EXTREMITIES:  No edema; No deformity   ASSESSMENT AND PLAN:   Elevated coronary calcium : I am going to repeat this has not been a few years.  Further management and testing will be based on these results.  We are going to practice aggressive risk reduction.   HTN: His blood pressure is blood pressure is not at target particular with new guidelines.  I am going to start him on valsartan  80 mg daily and he should start keeping a blood pressure diary.    Dyslipidemia: His LDL is 107 with an HDL of 66.  Given this and his elevated coronary calcium  I think it is prudent particular with his family history to drive his LDL down to a goal of at least 70.  And we will start Crestor  20 mg daily and check an NMR and high-sensitivity C-reactive protein in 3 months.  He does not have the ideal diet and we talked about this at length.  Palpitations:   He has not noticed ectopy.  He had this for a while.  No change in therapy.      Follow up with me in six months.   Signed,  Lynwood Schilling, MD   "

## 2024-03-25 ENCOUNTER — Ambulatory Visit: Attending: Cardiology | Admitting: Cardiology

## 2024-03-25 ENCOUNTER — Encounter: Payer: Self-pay | Admitting: Cardiology

## 2024-03-25 VITALS — BP 140/90 | HR 70 | Ht 75.0 in | Wt 212.0 lb

## 2024-03-25 DIAGNOSIS — I10 Essential (primary) hypertension: Secondary | ICD-10-CM | POA: Diagnosis not present

## 2024-03-25 DIAGNOSIS — R002 Palpitations: Secondary | ICD-10-CM | POA: Diagnosis not present

## 2024-03-25 DIAGNOSIS — E785 Hyperlipidemia, unspecified: Secondary | ICD-10-CM | POA: Diagnosis not present

## 2024-03-25 DIAGNOSIS — R9431 Abnormal electrocardiogram [ECG] [EKG]: Secondary | ICD-10-CM | POA: Diagnosis not present

## 2024-03-25 MED ORDER — ROSUVASTATIN CALCIUM 20 MG PO TABS: 20.0000 mg | ORAL_TABLET | Freq: Every day | ORAL | 3 refills | Status: AC

## 2024-03-25 MED ORDER — VALSARTAN 80 MG PO TABS: 80.0000 mg | ORAL_TABLET | Freq: Every day | ORAL | 3 refills | Status: AC

## 2024-03-25 NOTE — Patient Instructions (Signed)
 Medication Instructions:  Start Valsartan  80 mg once daily Start Crestor  20 mg once daily *If you need a refill on your cardiac medications before your next appointment, please call your pharmacy*  Lab Work: NMR, HSCRP in 3 months at [facility] If you have labs (blood work) drawn today and your tests are completely normal, you will receive your results only by: MyChart Message (if you have MyChart) OR A paper copy in the mail If you have any lab test that is abnormal or we need to change your treatment, we will call you to review the results.  Testing/Procedures: Coronary Calcium  Score Your physician has requested that you have a coronary calcium  score performed. This is not covered by insurance and will be an out-of-pocket cost of approximately $99.   Follow-Up: At [facility], you and your health needs are our priority.  As part of our continuing mission to provide you with exceptional heart care, our providers are all part of one team.  This team includes your primary Cardiologist (physician) and Advanced Practice Providers or APPs (Physician Assistants and Nurse Practitioners) who all work together to provide you with the care you need, when you need it.  Your next appointment:   6 month(s)  Provider:   Lynwood Schilling, MD    We recommend signing up for the patient portal called MyChart.  Sign up information is provided on this After Visit Summary.  MyChart is used to connect with patients for Virtual Visits (Telemedicine).  Patients are able to view lab/test results, encounter notes, upcoming appointments, etc.  Non-urgent messages can be sent to your provider as well.   To learn more about what you can do with MyChart, go to [email or web address].
